# Patient Record
Sex: Female | Born: 1975 | Race: White | Hispanic: No | Marital: Single | Smoking: Current some day smoker
Health system: Southern US, Community
[De-identification: ages and names within clinical notes are randomized; demographics above are authoritative.]

## PROBLEM LIST (undated history)

## (undated) DIAGNOSIS — F419 Anxiety disorder, unspecified: Secondary | ICD-10-CM

## (undated) DIAGNOSIS — K589 Irritable bowel syndrome without diarrhea: Secondary | ICD-10-CM

## (undated) DIAGNOSIS — M797 Fibromyalgia: Secondary | ICD-10-CM

## (undated) DIAGNOSIS — N83209 Unspecified ovarian cyst, unspecified side: Secondary | ICD-10-CM

## (undated) DIAGNOSIS — J40 Bronchitis, not specified as acute or chronic: Secondary | ICD-10-CM

## (undated) DIAGNOSIS — F319 Bipolar disorder, unspecified: Secondary | ICD-10-CM

## (undated) DIAGNOSIS — E079 Disorder of thyroid, unspecified: Secondary | ICD-10-CM

## (undated) HISTORY — PX: COLONOSCOPY: SHX174

---

## 2003-05-06 ENCOUNTER — Inpatient Hospital Stay (HOSPITAL_COMMUNITY): Admission: EM | Admit: 2003-05-06 | Discharge: 2003-05-12 | Payer: Self-pay | Admitting: Psychiatry

## 2013-09-02 ENCOUNTER — Encounter (HOSPITAL_COMMUNITY): Payer: Self-pay | Admitting: Emergency Medicine

## 2013-09-02 ENCOUNTER — Emergency Department (HOSPITAL_COMMUNITY)
Admission: EM | Admit: 2013-09-02 | Discharge: 2013-09-02 | Disposition: A | Discharge status: Home / Self Care | Payer: Medicaid - Out of State | Attending: Emergency Medicine | Admitting: Emergency Medicine

## 2013-09-02 DIAGNOSIS — K5900 Constipation, unspecified: Secondary | ICD-10-CM | POA: Insufficient documentation

## 2013-09-02 DIAGNOSIS — N898 Other specified noninflammatory disorders of vagina: Secondary | ICD-10-CM | POA: Insufficient documentation

## 2013-09-02 DIAGNOSIS — R61 Generalized hyperhidrosis: Secondary | ICD-10-CM | POA: Diagnosis not present

## 2013-09-02 DIAGNOSIS — R1032 Left lower quadrant pain: Secondary | ICD-10-CM | POA: Diagnosis present

## 2013-09-02 DIAGNOSIS — R319 Hematuria, unspecified: Secondary | ICD-10-CM | POA: Diagnosis not present

## 2013-09-02 DIAGNOSIS — Z862 Personal history of diseases of the blood and blood-forming organs and certain disorders involving the immune mechanism: Secondary | ICD-10-CM | POA: Diagnosis not present

## 2013-09-02 DIAGNOSIS — Z3202 Encounter for pregnancy test, result negative: Secondary | ICD-10-CM | POA: Diagnosis not present

## 2013-09-02 DIAGNOSIS — Z8639 Personal history of other endocrine, nutritional and metabolic disease: Secondary | ICD-10-CM | POA: Insufficient documentation

## 2013-09-02 DIAGNOSIS — R509 Fever, unspecified: Secondary | ICD-10-CM | POA: Diagnosis not present

## 2013-09-02 DIAGNOSIS — F17200 Nicotine dependence, unspecified, uncomplicated: Secondary | ICD-10-CM | POA: Insufficient documentation

## 2013-09-02 HISTORY — DX: Unspecified ovarian cyst, unspecified side: N83.209

## 2013-09-02 HISTORY — DX: Irritable bowel syndrome, unspecified: K58.9

## 2013-09-02 HISTORY — DX: Disorder of thyroid, unspecified: E07.9

## 2013-09-02 LAB — URINALYSIS, ROUTINE W REFLEX MICROSCOPIC
Bilirubin Urine: NEGATIVE
Glucose, UA: NEGATIVE mg/dL
Ketones, ur: NEGATIVE mg/dL
Leukocytes, UA: NEGATIVE
Nitrite: NEGATIVE
Specific Gravity, Urine: >1.03 — ABNORMAL HIGH (ref 1.005–1.030)
Urobilinogen, UA: 0.2 mg/dL (ref 0.0–1.0)
pH: 5.5 (ref 5.0–8.0)

## 2013-09-02 LAB — URINE MICROSCOPIC-ADD ON

## 2013-09-02 LAB — POCT PREGNANCY, URINE: Preg Test, Ur: NEGATIVE

## 2013-09-02 LAB — WET PREP, GENITAL
Clue Cells Wet Prep HPF POC: NONE SEEN
Trich, Wet Prep: NONE SEEN
Yeast Wet Prep HPF POC: NONE SEEN

## 2013-09-02 MED ORDER — HYDROMORPHONE HCL PF 1 MG/ML IJ SOLN
1.0000 mg | Freq: Once | INTRAMUSCULAR | Status: AC
  Administered 2013-09-02: 1 mg via INTRAMUSCULAR
  Filled 2013-09-02: qty 1

## 2013-09-02 MED ORDER — IBUPROFEN 400 MG PO TABS
600.0000 mg | ORAL_TABLET | Freq: Once | ORAL | Status: AC
  Administered 2013-09-02: 600 mg via ORAL
  Filled 2013-09-02: qty 2

## 2013-09-02 MED ORDER — OXYCODONE-ACETAMINOPHEN 5-325 MG PO TABS
2.0000 | ORAL_TABLET | Freq: Once | ORAL | Status: AC
  Administered 2013-09-02: 2 via ORAL
  Filled 2013-09-02: qty 2

## 2013-09-02 MED ORDER — OXYCODONE-ACETAMINOPHEN 5-325 MG PO TABS: 1.0000 | ORAL_TABLET | ORAL | Status: DC | PRN

## 2013-09-02 MED ORDER — CEPHALEXIN 500 MG PO CAPS: 500.0000 mg | ORAL_CAPSULE | Freq: Four times a day (QID) | ORAL | Status: DC

## 2013-09-02 NOTE — ED Notes (Signed)
Dr Kohut at bedside speaking with pt,  

## 2013-09-02 NOTE — ED Notes (Signed)
Low abd pain esp LLQ, onset 9pm yesterday,   Feels constipated.  Fever. Chills,  Nausea, no vomiting

## 2013-09-02 NOTE — ED Provider Notes (Signed)
CSN: [id number]     Arrival date & time 09/02/13  2028 History  This chart was scribed for Raeford Razor, MD by Blanchard Kelch, ED Scribe. The patient was seen in room APA03/APA03. Patient's care was started at 9:02 PM.    Chief Complaint  Patient presents with  . Abdominal Pain    Patient is a 37 y.o. female presenting with abdominal pain. The history is provided by the patient. No language interpreter was used.  Abdominal Pain Associated symptoms: fever, hematuria and vaginal discharge   Associated symptoms: no dysuria     HPI Comments: Sarah Morrow is a 37 y.o. female with a history of IBS and ovarian cysts who presents to the Emergency Department complaining of constant left sided abdominal pain that began last night. The pain has been radiating down to her left leg and back. She states she she has had associated fever and diaphoresis with the pain. She states that she ate "two bowls of pickles" and laid down and then her abdomen felt tight which then progressed into the pain. The pain is worsened by movement. She tried using heat on her abdomen and lying in a hot bath without relief. She states that she noticed hematuria when she was urinating for the urinalysis in the ED. She also reports constipation. She denies dysuria, frequency or decreased urine output. She has had white, milky discharge for about a week but denies any foul odor associated with it. She states this pain is similar to the IBS as well as pain she has had with prior ovarian cysts. She states that her last period was 11/24 and normal. She reports she has a history of irregular periods but does not notice any pattern to the pain with her menstrual cycle. She denies any surgeries on her abdomen. She denies any possibility that she could have an STI.  Past Medical History  Diagnosis Date  . Thyroid disease   . Ovarian cyst   . IBS (irritable bowel syndrome)    History reviewed. No pertinent past surgical history. History  reviewed. No pertinent family history. History  Substance Use Topics  . Smoking status: Current Some Day Smoker  . Smokeless tobacco: Not on file  . Alcohol Use: No   OB History   Grav Para Term Preterm Abortions TAB SAB Ect Mult Living                 Review of Systems  Constitutional: Positive for fever and diaphoresis.  Gastrointestinal: Positive for abdominal pain.  Genitourinary: Positive for hematuria and vaginal discharge. Negative for dysuria, frequency and decreased urine volume.  All other systems reviewed and are negative.    Allergies  Review of patient's allergies indicates no known allergies.  Home Medications  No current outpatient prescriptions on file. Triage Vitals: BP 112/79  Pulse 87  Temp(Src) 98.5 F (36.9 C) (Oral)  Resp 18  Ht 5\' 6"  (1.676 m)  Wt 120 lb (54.432 kg)  BMI 19.38 kg/m2  SpO2 100%  LMP 07/26/2013  Physical Exam  Nursing note and vitals reviewed. Constitutional: She is oriented to person, place, and time. She appears well-developed and well-nourished.  HENT:  Head: Normocephalic and atraumatic.  Eyes: Conjunctivae and EOM are normal. Pupils are equal, round, and reactive to light.  Neck: Normal range of motion. Neck supple.  Cardiovascular: Normal rate, regular rhythm and normal heart sounds.   Pulmonary/Chest: Effort normal and breath sounds normal.  Abdominal: Soft. Bowel sounds are normal. She exhibits no  distension. There is tenderness in the suprapubic area and left lower quadrant. There is guarding (voluntary). There is no rebound and no CVA tenderness.  Genitourinary:  Chaperone present. normal external female genitalia. Mild white/grey vaginal discharge. No cmt, adnexal mass or tenderness appreciated  Musculoskeletal: Normal range of motion.  Neurological: She is alert and oriented to person, place, and time.  Skin: Skin is warm and dry.  Psychiatric: She has a normal mood and affect. Her behavior is normal.    ED Course   Procedures (including critical care time)  DIAGNOSTIC STUDIES: Oxygen Saturation is 100% on room air, normal by my interpretation.    COORDINATION OF CARE: 9:12 PM - Patient verbalizes understanding and agrees with treatment plan.    Labs Review Labs Reviewed  URINALYSIS, ROUTINE W REFLEX MICROSCOPIC  POCT PREGNANCY, URINE   Imaging Review No results found.  EKG Interpretation   None       MDM   1. LLQ pain   2. UTI  I personally preformed the services scribed in my presence. The recorded information has been reviewed is accurate. Raeford Razor, MD.    Raeford Razor, MD 09/06/13 [phone]

## 2013-09-02 NOTE — ED Notes (Addendum)
Sudden onset L pelvic pain last night w/occasional R sided pain as well.  L>R.  Hx of ovarian cysts.  Denies any dysuria, but noted slight blood on toilet paper while obtaining specimen.  Reports some nausea w/out vomiting or diarrhea.  Had chills and sweating last night.  Took hydrocodone cough syrup and ibuprofen for pain w/out relief.  Tried lying in warm water w/out relief.   Has had thick, creamy, white, nonpuritic vaginal discharge. Denies use of birth control and states she could possibly be pregnant but hasn't done OTC test.

## 2013-09-06 LAB — GC/CHLAMYDIA PROBE AMP
CT Probe RNA: NEGATIVE
GC Probe RNA: NEGATIVE

## 2014-10-07 ENCOUNTER — Emergency Department (HOSPITAL_COMMUNITY): Payer: Medicaid Other

## 2014-10-07 ENCOUNTER — Emergency Department (HOSPITAL_COMMUNITY)
Admission: EM | Admit: 2014-10-07 | Discharge: 2014-10-07 | Disposition: A | Discharge status: Home / Self Care | Payer: Medicaid Other | Attending: Emergency Medicine | Admitting: Emergency Medicine

## 2014-10-07 ENCOUNTER — Encounter (HOSPITAL_COMMUNITY): Payer: Self-pay | Admitting: Emergency Medicine

## 2014-10-07 DIAGNOSIS — Z8739 Personal history of other diseases of the musculoskeletal system and connective tissue: Secondary | ICD-10-CM | POA: Insufficient documentation

## 2014-10-07 DIAGNOSIS — Z720 Tobacco use: Secondary | ICD-10-CM | POA: Diagnosis not present

## 2014-10-07 DIAGNOSIS — F419 Anxiety disorder, unspecified: Secondary | ICD-10-CM | POA: Diagnosis present

## 2014-10-07 DIAGNOSIS — R6883 Chills (without fever): Secondary | ICD-10-CM | POA: Diagnosis not present

## 2014-10-07 DIAGNOSIS — Z8719 Personal history of other diseases of the digestive system: Secondary | ICD-10-CM | POA: Diagnosis not present

## 2014-10-07 DIAGNOSIS — R05 Cough: Secondary | ICD-10-CM | POA: Diagnosis not present

## 2014-10-07 DIAGNOSIS — Z8742 Personal history of other diseases of the female genital tract: Secondary | ICD-10-CM | POA: Diagnosis not present

## 2014-10-07 DIAGNOSIS — R059 Cough, unspecified: Secondary | ICD-10-CM

## 2014-10-07 DIAGNOSIS — Z792 Long term (current) use of antibiotics: Secondary | ICD-10-CM | POA: Insufficient documentation

## 2014-10-07 DIAGNOSIS — R079 Chest pain, unspecified: Secondary | ICD-10-CM | POA: Diagnosis not present

## 2014-10-07 DIAGNOSIS — R0981 Nasal congestion: Secondary | ICD-10-CM | POA: Diagnosis not present

## 2014-10-07 DIAGNOSIS — Z8639 Personal history of other endocrine, nutritional and metabolic disease: Secondary | ICD-10-CM | POA: Diagnosis not present

## 2014-10-07 HISTORY — DX: Anxiety disorder, unspecified: F41.9

## 2014-10-07 HISTORY — DX: Bipolar disorder, unspecified: F31.9

## 2014-10-07 HISTORY — DX: Fibromyalgia: M79.7

## 2014-10-07 MED ORDER — GUAIFENESIN ER 600 MG PO TB12: 600.0000 mg | ORAL_TABLET | Freq: Two times a day (BID) | ORAL | Status: DC

## 2014-10-07 MED ORDER — SALINE SPRAY 0.65 % NA SOLN: 1.0000 | NASAL | Status: DC | PRN

## 2014-10-07 MED ORDER — FLUTICASONE PROPIONATE 50 MCG/ACT NA SUSP: 2.0000 | Freq: Every day | NASAL | Status: DC

## 2014-10-07 MED ORDER — IBUPROFEN 800 MG PO TABS: 800.0000 mg | ORAL_TABLET | Freq: Three times a day (TID) | ORAL | Status: DC

## 2014-10-07 NOTE — Discharge Instructions (Signed)
Used both nasal sprays as directed along with taking Mucinex. Take ibuprofen as directed for pain. Follow-up with the wellness clinic or one of the resources below to establish care with a primary care physician.   Cough, Adult  A cough is a reflex that helps clear your throat and airways. It can help heal the body or may be a reaction to an irritated airway. A cough may only last 2 or 3 weeks (acute) or may last more than 8 weeks (chronic).  CAUSES Acute cough:  Viral or bacterial infections. Chronic cough:  Infections.  Allergies.  Asthma.  Post-nasal drip.  Smoking.  Heartburn or acid reflux.  Some medicines.  Chronic lung problems (COPD).  Cancer. SYMPTOMS   Cough.  Fever.  Chest pain.  Increased breathing rate.  High-pitched whistling sound when breathing (wheezing).  Colored mucus that you cough up (sputum). TREATMENT   A bacterial cough may be treated with antibiotic medicine.  A viral cough must run its course and will not respond to antibiotics.  Your caregiver may recommend other treatments if you have a chronic cough. HOME CARE INSTRUCTIONS   Only take over-the-counter or prescription medicines for pain, discomfort, or fever as directed by your caregiver. Use cough suppressants only as directed by your caregiver.  Use a cold steam vaporizer or humidifier in your bedroom or home to help loosen secretions.  Sleep in a semi-upright position if your cough is worse at night.  Rest as needed.  Stop smoking if you smoke. SEEK IMMEDIATE MEDICAL CARE IF:   You have pus in your sputum.  Your cough starts to worsen.  You cannot control your cough with suppressants and are losing sleep.  You begin coughing up blood.  You have difficulty breathing.  You develop pain which is getting worse or is uncontrolled with medicine.  You have a fever. MAKE SURE YOU:   Understand these instructions.  Will watch your condition.  Will get help right  away if you are not doing well or get worse. Document Released: 02/16/2011 Document Revised: 11/12/2011 Document Reviewed: 02/16/2011 [facility] Patient Information 2015 [place], [place]. This information is not intended to replace advice given to you by your health care provider. Make sure you discuss any questions you have with your health care provider.  Panic Attacks Panic attacks are sudden, short-livedsurges of severe anxiety, fear, or discomfort. They may occur for no reason when you are relaxed, when you are anxious, or when you are sleeping. Panic attacks may occur for a number of reasons:   Healthy people occasionally have panic attacks in extreme, life-threatening situations, such as war or natural disasters. Normal anxiety is a protective mechanism of the body that helps [place] react to danger (fight or flight response).  Panic attacks are often seen with anxiety disorders, such as panic disorder, social anxiety disorder, generalized anxiety disorder, and phobias. Anxiety disorders cause excessive or uncontrollable anxiety. They may interfere with your relationships or other life activities.  Panic attacks are sometimes seen with other mental illnesses, such as depression and posttraumatic stress disorder.  Certain medical conditions, prescription medicines, and drugs of abuse can cause panic attacks. SYMPTOMS  Panic attacks start suddenly, peak within 20 minutes, and are accompanied by four or more of the following symptoms:  Pounding heart or fast heart rate (palpitations).  Sweating.  Trembling or shaking.  Shortness of breath or feeling smothered.  Feeling choked.  Chest pain or discomfort.  Nausea or strange feeling in your stomach.  Dizziness,  light-headedness, or feeling like you will faint.  Chills or hot flushes.  Numbness or tingling in your lips or hands and feet.  Feeling that things are not real or feeling that you are not yourself.  Fear of losing control or  going crazy.  Fear of dying. Some of these symptoms can mimic serious medical conditions. For example, you may think you are having a heart attack. Although panic attacks can be very scary, they are not life threatening. DIAGNOSIS  Panic attacks are diagnosed through an assessment by your health care provider. Your health care provider will ask questions about your symptoms, such as where and when they occurred. Your health care provider will also ask about your medical history and use of alcohol and drugs, including prescription medicines. Your health care provider may order blood tests or other studies to rule out a serious medical condition. Your health care provider may refer you to a mental health professional for further evaluation. TREATMENT   Most healthy people who have one or two panic attacks in an extreme, life-threatening situation will not require treatment.  The treatment for panic attacks associated with anxiety disorders or other mental illness typically involves counseling with a mental health professional, medicine, or a combination of both. Your health care provider will help determine what treatment is best for you.  Panic attacks due to physical illness usually go away with treatment of the illness. If prescription medicine is causing panic attacks, talk with your health care provider about stopping the medicine, decreasing the dose, or substituting another medicine.  Panic attacks due to alcohol or drug abuse go away with abstinence. Some adults need professional help in order to stop drinking or using drugs. HOME CARE INSTRUCTIONS   Take all medicines as directed by your health care provider.   Schedule and attend follow-up visits as directed by your health care provider. It is important to keep all your appointments. SEEK MEDICAL CARE IF:  You are not able to take your medicines as prescribed.  Your symptoms do not improve or get worse. SEEK IMMEDIATE MEDICAL CARE  IF:   You experience panic attack symptoms that are different than your usual symptoms.  You have serious thoughts about hurting yourself or others.  You are taking medicine for panic attacks and have a serious side effect. MAKE SURE YOU:  Understand these instructions.  Will watch your condition.  Will get help right away if you are not doing well or get worse. Document Released: 08/20/2005 Document Revised: 08/25/2013 Document Reviewed: 04/03/2013 [facility] Patient Information 2015 [place]. This information is not intended to replace advice given to you by your health care provider. Make sure you discuss any questions you have with your health care provider.  Cool Mist Vaporizers Vaporizers may help relieve the symptoms of a cough and cold. They add moisture to the air, which helps mucus to become thinner and less sticky. This makes it easier to breathe and cough up secretions. Cool mist vaporizers do not cause serious burns like hot mist vaporizers, which may also be called steamers or humidifiers. Vaporizers have not been proven to help with colds. You should not use a vaporizer if you are allergic to mold. HOME CARE INSTRUCTIONS  Follow the package instructions for the vaporizer.  Do not use anything other than distilled water in the vaporizer.  Do not run the vaporizer all of the time. This can cause mold or bacteria to grow in the vaporizer.  Clean the vaporizer after  each time it is used.  Clean and dry the vaporizer well before storing it.  Stop using the vaporizer if worsening respiratory symptoms develop. Document Released: 05/17/2004 Document Revised: 08/25/2013 Document Reviewed: 01/07/2013 [facility] Patient Information 2015 [place], [place]. This information is not intended to replace advice given to you by your health care provider. Make sure you discuss any questions you have with your health care provider.  RESOURCE GUIDE  Chronic Pain Problems: Contact  [facility]  [phone] Patients need to be referred by their primary care doctor.  Insufficient Money for Medicine: Contact [employer]:  call "211."   No Primary Care Doctor: - Call [facility]  [phone] - can help you locate a primary care doctor that  accepts your insurance, provides certain services, etc. - Physician Referral Service- [phone]  Agencies that provide inexpensive medical care: - [facility]  [phone] - [facility]  [phone] - [facility]  [phone] - [facility]  [phone] - [facility]  [phone] - [facility]  [phone]  Medicaid-accepting [facility] Providers: - [facility]- [address]  [phone], Mon-Fri 9am-7pm, Sat 9am-1pm - [facility]- [address] [place], [address] [place]  [phone] - [facility]- [address] [place]  [phone] [facility]- [address]  [phone] - Renaye Rakers- [address] [place], [address], [phone]  Only accepts [place] patients after they have their name  applied to their card  Self Pay (no insurance) in [place]: - Sickle Cell Patients: Dr Willey Blade, [facility]  [address] [place], [phone] - [facility]- [address] [place]  [phone]       [facility]- [address]       -     [facility]- see information above (Speak to [employer] if you do not have insurance)       -  [facility]- [address],  [phone]       -  [facility]- [address] [phone]       -  Dr Julio Sicks-  [address], [place], [phone]       -  [facility] - [address] [phone]       -  [facility]- [address], [phone], also [address], [phone]        -    [facility]- [address] [place], [phone], 1st & 3rd Saturday        every month, 10am-1pm  1) Find a Doctor and Pay Out of Pocket Although you won't have to find out who is covered by your insurance plan, it is a good idea to ask around and get recommendations. You will then need to call the office and see if the doctor you have chosen will accept you as a new patient and what types of options they offer for patients who are self-pay. Some doctors offer discounts or will set up payment plans for their patients who do not have insurance, but you will need to ask so you aren't surprised when you get to your appointment.  2) Contact Your Local Health Department Not all health departments have doctors that  can see patients for sick visits, but many do, so it is worth a call to see if yours does. If you don't know where your local health department is, you can check in your phone book. The CDC also has a tool to help you locate your state's health department, and many state websites also have listings of all of their local health departments.  3) Find a Walk-in Clinic If your illness is not likely to be very severe or complicated, you may want to try a walk in clinic. These are popping up all over the country in pharmacies, drugstores, and shopping centers. They're usually staffed by nurse practitioners or physician assistants that have been trained to treat common illnesses and complaints. They're usually fairly quick and inexpensive. However, if you have serious medical issues or chronic medical problems, these are probably not your best option

## 2014-10-07 NOTE — ED Provider Notes (Signed)
CSN: [id number]     Arrival date & time 10/07/14  1941 History   First MD Initiated Contact with Patient 10/07/14 2103     Chief Complaint  Patient presents with  . Anxiety  . Cough     (Consider location/radiation/quality/duration/timing/severity/associated sxs/prior Treatment) HPI Comments: 39 year old female with a past medical history of thyroid disease, anxiety, ovarian cyst, IBS, bipolar disorder and fibromyalgia presenting with cough 3 weeks. Patient reports she feels congested, both nasally and in her chest, has been clearing her throat and occasionally spitting up phlegm. She has tried taking her children's cough medicine with no relief. Denies fever, however endorses occasional chills. Patient also reports she had an anxiety attack today, followed by midsternal chest pain, worse with coughing. States she is under a lot of stress. Denies suicidal or homicidal ideation.  Patient is a 39 y.o. female presenting with anxiety and cough. The history is provided by the patient.  Anxiety Associated symptoms include chest pain, chills, congestion and coughing.  Cough Associated symptoms: chest pain and chills     Past Medical History  Diagnosis Date  . Thyroid disease   . Ovarian cyst   . IBS (irritable bowel syndrome)   . Bipolar 1 disorder   . Fibromyalgia   . Anxiety    Past Surgical History  Procedure Laterality Date  . Colonoscopy     No family history on file. History  Substance Use Topics  . Smoking status: Current Some Day Smoker  . Smokeless tobacco: Not on file  . Alcohol Use: No   OB History    No data available     Review of Systems  Constitutional: Positive for chills.  HENT: Positive for congestion and sinus pressure.   Respiratory: Positive for cough.   Cardiovascular: Positive for chest pain.  Psychiatric/Behavioral: The patient is nervous/anxious.   All other systems reviewed and are negative.     Allergies  Review of patient's allergies  indicates no known allergies.  Home Medications   Prior to Admission medications   Medication Sig Start Date End Date Taking? Authorizing Provider  cephALEXin (KEFLEX) 500 MG capsule Take 1 capsule (500 mg total) by mouth 4 (four) times daily. 09/02/13   Raeford RazorStephen Kohut, MD  fluticasone (FLONASE) 50 MCG/ACT nasal spray Place 2 sprays into both nostrils daily. 10/07/14   Kathrynn Speedobyn M Kadisha Goodine, PA-C  guaiFENesin (MUCINEX) 600 MG 12 hr tablet Take 1 tablet (600 mg total) by mouth 2 (two) times daily. 10/07/14   Kathrynn Speedobyn M George Alcantar, PA-C  ibuprofen (ADVIL,MOTRIN) 800 MG tablet Take 1 tablet (800 mg total) by mouth 3 (three) times daily. 10/07/14   Kathrynn Speedobyn M Nanea Jared, PA-C  oxyCODONE-acetaminophen (PERCOCET/ROXICET) 5-325 MG per tablet Take 1-2 tablets by mouth every 4 (four) hours as needed for severe pain. 09/02/13   Raeford RazorStephen Kohut, MD  sodium chloride (OCEAN) 0.65 % SOLN nasal spray Place 1 spray into both nostrils as needed for congestion. 10/07/14   Treylon Henard M Haleema Vanderheyden, PA-C   BP 102/85 mmHg  Pulse 74  Temp(Src) 97.7 F (36.5 C) (Oral)  Resp 14  SpO2 99%  LMP 10/05/2014 Physical Exam  Constitutional: She is oriented to person, place, and time. She appears well-developed and well-nourished. No distress.  HENT:  Head: Normocephalic and atraumatic.  Mouth/Throat: Oropharynx is clear and moist.  Nasal congestion, mucosal edema, postnasal drip. Bilateral maxillary sinus tenderness.  Eyes: Conjunctivae and EOM are normal. Pupils are equal, round, and reactive to light.  Neck: Normal range of motion. Neck supple.  No JVD present.  Cardiovascular: Normal rate, regular rhythm, normal heart sounds and intact distal pulses.   No extremity edema.  Pulmonary/Chest: Effort normal and breath sounds normal. No respiratory distress. She exhibits tenderness.  TTP across entire chest.  Abdominal: Soft. Bowel sounds are normal. There is no tenderness.  Musculoskeletal: Normal range of motion. She exhibits no edema.  Neurological: She is  alert and oriented to person, place, and time. She has normal strength. No sensory deficit.  Speech fluent, goal oriented. Moves limbs without ataxia. Equal grip strength bilateral.  Skin: Skin is warm and dry. She is not diaphoretic.  Psychiatric: Her behavior is normal. Her mood appears anxious.  Nursing note and vitals reviewed.   ED Course  Procedures (including critical care time) Labs Review Labs Reviewed - No data to display  Imaging Review Dg Chest 2 View  10/07/2014   CLINICAL DATA:  39 year old female with a history of dry cough.  EXAM: CHEST - 2 VIEW  COMPARISON:  None.  FINDINGS: Cardiomediastinal silhouette projects within normal limits in size and contour. No confluent airspace disease, pneumothorax, or pleural effusion.  No displaced fracture.  Unremarkable appearance of the upper abdomen.  IMPRESSION: No radiographic evidence of acute cardiopulmonary disease.  Signed,  Yvone Neu. Loreta Ave, DO  [facility]  [facility]   Electronically Signed   By: Gilmer Mor D.O.   On: 10/07/2014 20:47     EKG Interpretation None      MDM   Final diagnoses:  Cough  Sinus congestion  Anxiety   Patient nontoxic appearing and in no apparent distress. AFVSS. Chest pain reproducible. Chest pain most likely from coughing and anxiety. Doubt cardiac. Chest x-ray obtained in triage prior to patient being seen, no acute findings. Lungs clear. Treat sinus congestion and cough with Mucinex, Flonase, nasal saline. Ibuprofen for pain. Resources given for PCP follow-up. Stable for discharge. Return precautions given. Patient states understanding of treatment care plan and is agreeable.  Kathrynn Speed, PA-C 10/07/14 2125  Gerhard Munch, MD 10/07/14 2150

## 2014-10-07 NOTE — ED Notes (Signed)
Pt. reports anxiety attack today and dry cough with chest congestion , denies fever or chills / respirations unlabored .

## 2014-12-15 ENCOUNTER — Encounter (HOSPITAL_COMMUNITY): Payer: Self-pay | Admitting: Emergency Medicine

## 2014-12-15 ENCOUNTER — Emergency Department (HOSPITAL_COMMUNITY)
Admission: EM | Admit: 2014-12-15 | Discharge: 2014-12-15 | Disposition: A | Discharge status: Home / Self Care | Payer: Medicaid Other | Source: Home / Self Care | Attending: Emergency Medicine | Admitting: Emergency Medicine

## 2014-12-15 DIAGNOSIS — J111 Influenza due to unidentified influenza virus with other respiratory manifestations: Secondary | ICD-10-CM | POA: Diagnosis not present

## 2014-12-15 MED ORDER — OSELTAMIVIR PHOSPHATE 75 MG PO CAPS: 75.0000 mg | ORAL_CAPSULE | Freq: Two times a day (BID) | ORAL | Status: DC

## 2014-12-15 NOTE — Discharge Instructions (Signed)
You have the flu. Get plenty of rest and drink plenty of fluids. Take Tamiflu as prescribed. Alternate Tylenol and ibuprofen every 4 hours help with fever and body aches. Follow-up as needed.

## 2014-12-15 NOTE — ED Notes (Signed)
Pt woke up this morning with a sore throat, bilateral earache, diarrhea and generalized body aches.

## 2014-12-15 NOTE — ED Provider Notes (Signed)
CSN: [id number]     Arrival date & time 12/15/14  1207 History   First MD Initiated Contact with Patient 12/15/14 1235     Chief Complaint  Patient presents with  . Sore Throat  . Cough  . Diarrhea  . Generalized Body Aches   (Consider location/radiation/quality/duration/timing/severity/associated sxs/prior Treatment) HPI  She is a 39 year old woman here for evaluation of body aches. Her symptoms started this morning with generalized severe body aches, nasal congestion, cough, sore throat, ear pain. She denies any nausea or vomiting. She denies any shortness of breath. No fever. She does report chills. No known sick contacts.  Past Medical History  Diagnosis Date  . Thyroid disease   . Ovarian cyst   . IBS (irritable bowel syndrome)   . Bipolar 1 disorder   . Fibromyalgia   . Anxiety    Past Surgical History  Procedure Laterality Date  . Colonoscopy     History reviewed. No pertinent family history. History  Substance Use Topics  . Smoking status: Current Some Day Smoker  . Smokeless tobacco: Not on file  . Alcohol Use: No   OB History    No data available     Review of Systems  Constitutional: Positive for fever, appetite change and fatigue.  HENT: Positive for congestion, ear pain and sore throat. Negative for rhinorrhea.   Respiratory: Positive for cough. Negative for shortness of breath.   Gastrointestinal: Positive for diarrhea. Negative for nausea and vomiting.  Musculoskeletal: Positive for myalgias.    Allergies  Review of patient's allergies indicates no known allergies.  Home Medications   Prior to Admission medications   Medication Sig Start Date End Date Taking? Authorizing Provider  clonazePAM (KLONOPIN) 1 MG tablet Take 1 mg by mouth 4 (four) times daily.   Yes Historical Provider, MD  lamoTRIgine (LAMICTAL) 200 MG tablet Take 200 mg by mouth daily.   Yes Historical Provider, MD  lisdexamfetamine (VYVANSE) 70 MG capsule Take 70 mg by mouth daily.    Yes Historical Provider, MD  ibuprofen (ADVIL,MOTRIN) 800 MG tablet Take 1 tablet (800 mg total) by mouth 3 (three) times daily. 10/07/14   Kathrynn Speedobyn M Hess, PA-C  oseltamivir (TAMIFLU) 75 MG capsule Take 1 capsule (75 mg total) by mouth every 12 (twelve) hours. 12/15/14   Charm RingsErin J Honig, MD  oxyCODONE-acetaminophen (PERCOCET/ROXICET) 5-325 MG per tablet Take 1-2 tablets by mouth every 4 (four) hours as needed for severe pain. 09/02/13   Raeford RazorStephen Kohut, MD   BP 101/70 mmHg  Pulse 95  Temp(Src) 98.7 F (37.1 C) (Oral)  Resp 18  SpO2 97%  LMP 11/16/2014 (Exact Date) Physical Exam  Constitutional: She is oriented to person, place, and time. She appears well-developed and well-nourished. She appears distressed (looks ill).  HENT:  Head: Normocephalic and atraumatic.  Right Ear: External ear normal. Tympanic membrane is retracted.  Left Ear: External ear normal. Tympanic membrane is retracted.  Nose: Rhinorrhea present. No mucosal edema.  Mouth/Throat: Oropharynx is clear and moist. No oropharyngeal exudate.  Neck: Neck supple.  Cardiovascular: Normal rate, regular rhythm and normal heart sounds.   No murmur heard. Pulmonary/Chest: Effort normal and breath sounds normal. No respiratory distress. She has no wheezes. She has no rales.  Lymphadenopathy:    She has no cervical adenopathy.  Neurological: She is alert and oriented to person, place, and time.    ED Course  Procedures (including critical care time) Labs Review Labs Reviewed - No data to display  Imaging Review  No results found.   MDM   1. Influenza    We'll treat with Tamiflu. Plenty of rest and fluids. Tylenol or ibuprofen for body aches and fever. Follow-up as needed.    Charm Rings, MD 12/15/14 1308

## 2015-01-06 ENCOUNTER — Encounter (HOSPITAL_COMMUNITY): Payer: Self-pay | Admitting: Emergency Medicine

## 2015-01-06 ENCOUNTER — Emergency Department (HOSPITAL_COMMUNITY)
Admission: EM | Admit: 2015-01-06 | Discharge: 2015-01-06 | Disposition: A | Discharge status: Home / Self Care | Payer: Medicaid Other | Attending: Emergency Medicine | Admitting: Emergency Medicine

## 2015-01-06 DIAGNOSIS — F319 Bipolar disorder, unspecified: Secondary | ICD-10-CM | POA: Diagnosis not present

## 2015-01-06 DIAGNOSIS — Z8639 Personal history of other endocrine, nutritional and metabolic disease: Secondary | ICD-10-CM | POA: Diagnosis not present

## 2015-01-06 DIAGNOSIS — Z720 Tobacco use: Secondary | ICD-10-CM | POA: Insufficient documentation

## 2015-01-06 DIAGNOSIS — Z8742 Personal history of other diseases of the female genital tract: Secondary | ICD-10-CM | POA: Diagnosis not present

## 2015-01-06 DIAGNOSIS — Z79899 Other long term (current) drug therapy: Secondary | ICD-10-CM | POA: Insufficient documentation

## 2015-01-06 DIAGNOSIS — R001 Bradycardia, unspecified: Secondary | ICD-10-CM | POA: Insufficient documentation

## 2015-01-06 DIAGNOSIS — R58 Hemorrhage, not elsewhere classified: Secondary | ICD-10-CM | POA: Diagnosis not present

## 2015-01-06 DIAGNOSIS — Z791 Long term (current) use of non-steroidal anti-inflammatories (NSAID): Secondary | ICD-10-CM | POA: Insufficient documentation

## 2015-01-06 DIAGNOSIS — M2550 Pain in unspecified joint: Secondary | ICD-10-CM | POA: Diagnosis not present

## 2015-01-06 DIAGNOSIS — R52 Pain, unspecified: Secondary | ICD-10-CM | POA: Diagnosis present

## 2015-01-06 DIAGNOSIS — Z8719 Personal history of other diseases of the digestive system: Secondary | ICD-10-CM | POA: Diagnosis not present

## 2015-01-06 DIAGNOSIS — F419 Anxiety disorder, unspecified: Secondary | ICD-10-CM | POA: Insufficient documentation

## 2015-01-06 DIAGNOSIS — Z7952 Long term (current) use of systemic steroids: Secondary | ICD-10-CM | POA: Diagnosis not present

## 2015-01-06 DIAGNOSIS — M797 Fibromyalgia: Secondary | ICD-10-CM | POA: Diagnosis not present

## 2015-01-06 DIAGNOSIS — J069 Acute upper respiratory infection, unspecified: Secondary | ICD-10-CM | POA: Insufficient documentation

## 2015-01-06 MED ORDER — TRAMADOL HCL 50 MG PO TABS: ORAL_TABLET | ORAL | Status: DC

## 2015-01-06 MED ORDER — LEVOTHYROXINE SODIUM 75 MCG PO TABS: ORAL_TABLET | ORAL | Status: DC

## 2015-01-06 MED ORDER — LORATADINE-PSEUDOEPHEDRINE ER 5-120 MG PO TB12
1.0000 | ORAL_TABLET | Freq: Two times a day (BID) | ORAL | Status: DC
Start: 2015-01-06 — End: 2015-05-06

## 2015-01-06 NOTE — ED Provider Notes (Signed)
CSN: [id number]     Arrival date & time 01/06/15  1656 History   First MD Initiated Contact with Patient 01/06/15 1806     Chief Complaint  Patient presents with  . Generalized Body Aches  . Sore Throat     (Consider location/radiation/quality/duration/timing/severity/associated sxs/prior Treatment) HPI Comments: Patient is a 39 year old female who presents to the emergency department with generalized body aches and sore throat. The patient states that this has been going on for some time now. She most recently was seen approximately 3 or 4 days [facility] which time she was told that she had a bronchitis. She had been told 2 weeks prior to that she had the flu. She states that she just doesn't feel as though she is getting any better. She has been treated with Zithromax, prednisone, and Tessalon Perles, but feels like she is not getting any better.  It is of note that the patient suffers from thyroid disorder, and has not been taking her thyroid medication for several months. It is also note that she suffers from fibromyalgia, and has not been taking medicines for this either. The patient suffers from irritable bowel syndrome and has not had this evaluated for several months. The patient states that she is not eating well, she's not sleeping well, she is not keeping up with fluids. She came to the emergency department because she was told by her family that she could possibly "die" if she did not go get herself straight now, especially with regard to her thyroid. The patient states she has a dry cough. She denies any hemoptysis. She denies any temperature elevation. She denies any blood in the urine, and she denies any blood in her stool.  Patient is a 39 y.o. female presenting with pharyngitis. The history is provided by the patient.  Sore Throat This is a new problem. The current episode started in the past 7 days. The problem occurs intermittently. The problem has been gradually  worsening. Associated symptoms include arthralgias, chills, congestion, coughing, fatigue, myalgias and a sore throat. Pertinent negatives include no fever. The symptoms are aggravated by swallowing. Treatments tried: tessalon. The treatment provided moderate relief.    Past Medical History  Diagnosis Date  . Thyroid disease   . Ovarian cyst   . IBS (irritable bowel syndrome)   . Bipolar 1 disorder   . Fibromyalgia   . Anxiety    Past Surgical History  Procedure Laterality Date  . Colonoscopy     History reviewed. No pertinent family history. History  Substance Use Topics  . Smoking status: Current Some Day Smoker  . Smokeless tobacco: Not on file  . Alcohol Use: No   OB History    No data available     Review of Systems  Constitutional: Positive for chills and fatigue. Negative for fever.  HENT: Positive for congestion, postnasal drip and sore throat.   Respiratory: Positive for cough.   Cardiovascular: Negative for palpitations and leg swelling.  Musculoskeletal: Positive for myalgias and arthralgias.  Hematological: Bruises/bleeds easily.  All other systems reviewed and are negative.     Allergies  Review of patient's allergies indicates no known allergies.  Home Medications   Prior to Admission medications   Medication Sig Start Date End Date Taking? Authorizing Provider  azithromycin (ZITHROMAX) 250 MG tablet Take 250-500 mg by mouth See admin instructions. Take two tablets on day 1` then take on tablet on days 2 through 5   Yes Historical Provider,  MD  benzonatate (TESSALON) 100 MG capsule Take 100 mg by mouth 3 (three) times daily as needed for cough.   Yes Historical Provider, MD  clonazePAM (KLONOPIN) 1 MG tablet Take 2 mg by mouth at bedtime.    Yes Historical Provider, MD  lamoTRIgine (LAMICTAL) 200 MG tablet Take 200 mg by mouth at bedtime.   Yes Historical Provider, MD  lisdexamfetamine (VYVANSE) 70 MG capsule Take 70 mg by mouth every morning.    Yes  Historical Provider, MD  predniSONE (DELTASONE) 20 MG tablet Take 40 mg by mouth daily with lunch. 5 day supply filled 01/04/15   Yes Historical Provider, MD  ibuprofen (ADVIL,MOTRIN) 800 MG tablet Take 1 tablet (800 mg total) by mouth 3 (three) times daily. Patient not taking: Reported on 01/06/2015 10/07/14   Kathrynn Speed, PA-C  oseltamivir (TAMIFLU) 75 MG capsule Take 1 capsule (75 mg total) by mouth every 12 (twelve) hours. Patient not taking: Reported on 01/06/2015 12/15/14   Charm Rings, MD  oxyCODONE-acetaminophen (PERCOCET/ROXICET) 5-325 MG per tablet Take 1-2 tablets by mouth every 4 (four) hours as needed for severe pain. Patient not taking: Reported on 01/06/2015 09/02/13   Raeford Razor, MD   BP 113/67 mmHg  Pulse 63  Temp(Src) 98.3 F (36.8 C) (Oral)  Resp 20  Ht  (1.676 m)  Wt 121 lb (54.885 kg)  BMI 19.54 kg/m2  SpO2 100%  LMP 12/17/2014 Physical Exam  Constitutional: She is oriented to person, place, and time. She appears well-developed and well-nourished.  Non-toxic appearance.  HENT:  Head: Normocephalic.  Right Ear: Tympanic membrane and external ear normal.  Left Ear: Tympanic membrane and external ear normal.  Mild to moderate nasal congestion present.  Eyes: EOM and lids are normal. Pupils are equal, round, and reactive to light.  Neck: Normal range of motion. Neck supple. Carotid bruit is not present.  Cardiovascular: Regular rhythm, normal heart sounds, intact distal pulses and normal pulses.  Bradycardia present.   Bradycardia of 50.  Pulmonary/Chest: Breath sounds normal. No respiratory distress.  Abdominal: Soft. Bowel sounds are normal. There is no tenderness. There is no guarding.  Musculoskeletal: Normal range of motion.  Lymphadenopathy:       Head (right side): No submandibular adenopathy present.       Head (left side): No submandibular adenopathy present.    She has no cervical adenopathy.  Neurological: She is alert and oriented to person, place, and  time. She has normal strength. No cranial nerve deficit or sensory deficit.  Skin: Skin is warm and dry.  Few bruises on the anterior lower extremities right and left.  Psychiatric: She has a normal mood and affect. Her speech is normal.  Nursing note and vitals reviewed.   ED Course  Procedures (including critical care time) Labs Review Labs Reviewed - No data to display  Imaging Review No results found.   EKG Interpretation None      MDM  This patient has not taken her thyroid medication for several months. We'll start levothyroxin. The patient also has not taken any medication for taken any medication for fibromyalgia. Will use Ultram every 6 hours. I have strongly encouraged patient to speak with her Medicaid physician on tomorrow to resume these medications and to be rechecked. The vital signs are within normal limits, with exception of the patient having a bradycardia of 50 by my evaluation. I've also encouraged the patient to increase fluids, eat her meals on time. The patient is to  return to the emergency department if any changes, problems, or concerns before she is evaluated by her Medicaid physician.    Final diagnoses:  None    **I have reviewed nursing notes, vital signs, and all appropriate lab and imaging results for this patient.Ivery Quale*    Oniyah Rohe, PA-C 01/06/15 2128  Margarita Grizzleanielle Ray, MD 01/06/15 [phone]

## 2015-01-06 NOTE — Discharge Instructions (Signed)
Your heart rate is lower than it should be. I am concerned that this may be related to your thyroid. It is imperative that you speak with your doctor tomorrow for an appointment as sone as possible concerning your multiple medical problems. Please use the Synthroid 75 mg one at bedtime. Please use the Claritin-D every 12 hours for congestion and cough. Please use the Tylenol or ibuprofen for mild discomfort. Use Ultram for more severe pain. This medication may cause drowsiness, please use with caution. Please increase your water, juices, Gatorade, etc. Please eat her meals on time. Upper Respiratory Infection, Adult An upper respiratory infection (URI) is also sometimes known as the common cold. The upper respiratory tract includes the nose, sinuses, throat, trachea, and bronchi. Bronchi are the airways leading to the lungs. Most people improve within 1 week, but symptoms can last up to 2 weeks. A residual cough may last even longer.  CAUSES Many different viruses can infect the tissues lining the upper respiratory tract. The tissues become irritated and inflamed and often become very moist. Mucus production is also common. A cold is contagious. You can easily spread the virus to others by oral contact. This includes kissing, sharing a glass, coughing, or sneezing. Touching your mouth or nose and then touching a surface, which is then touched by another person, can also spread the virus. SYMPTOMS  Symptoms typically develop 1 to 3 days after you come in contact with a cold virus. Symptoms vary from person to person. They may include:  Runny nose.  Sneezing.  Nasal congestion.  Sinus irritation.  Sore throat.  Loss of voice (laryngitis).  Cough.  Fatigue.  Muscle aches.  Loss of appetite.  Headache.  Low-grade fever. DIAGNOSIS  You might diagnose your own cold based on familiar symptoms, since most people get a cold 2 to 3 times a year. Your caregiver can confirm this based on your  exam. Most importantly, your caregiver can check that your symptoms are not due to another disease such as strep throat, sinusitis, pneumonia, asthma, or epiglottitis. Blood tests, throat tests, and X-rays are not necessary to diagnose a common cold, but they may sometimes be helpful in excluding other more serious diseases. Your caregiver will decide if any further tests are required. RISKS AND COMPLICATIONS  You may be at risk for a more severe case of the common cold if you smoke cigarettes, have chronic heart disease (such as heart failure) or lung disease (such as asthma), or if you have a weakened immune system. The very young and very old are also at risk for more serious infections. Bacterial sinusitis, middle ear infections, and bacterial pneumonia can complicate the common cold. The common cold can worsen asthma and chronic obstructive pulmonary disease (COPD). Sometimes, these complications can require emergency medical care and may be life-threatening. PREVENTION  The best way to protect against getting a cold is to practice good hygiene. Avoid oral or hand contact with people with cold symptoms. Wash your hands often if contact occurs. There is no clear evidence that vitamin C, vitamin E, echinacea, or exercise reduces the chance of developing a cold. However, it is always recommended to get plenty of rest and practice good nutrition. TREATMENT  Treatment is directed at relieving symptoms. There is no cure. Antibiotics are not effective, because the infection is caused by a virus, not by bacteria. Treatment may include:  Increased fluid intake. Sports drinks offer valuable electrolytes, sugars, and fluids.  Breathing heated mist or steam (vaporizer  or shower).  Eating chicken soup or other clear broths, and maintaining good nutrition.  Getting plenty of rest.  Using gargles or lozenges for comfort.  Controlling fevers with ibuprofen or acetaminophen as directed by your  caregiver.  Increasing usage of your inhaler if you have asthma. Zinc gel and zinc lozenges, taken in the first 24 hours of the common cold, can shorten the duration and lessen the severity of symptoms. Pain medicines may help with fever, muscle aches, and throat pain. A variety of non-prescription medicines are available to treat congestion and runny nose. Your caregiver can make recommendations and may suggest nasal or lung inhalers for other symptoms.  HOME CARE INSTRUCTIONS   Only take over-the-counter or prescription medicines for pain, discomfort, or fever as directed by your caregiver.  Use a warm mist humidifier or inhale steam from a shower to increase air moisture. This may keep secretions moist and make it easier to breathe.  Drink enough water and fluids to keep your urine clear or pale yellow.  Rest as needed.  Return to work when your temperature has returned to normal or as your caregiver advises. You may need to stay home longer to avoid infecting others. You can also use a face mask and careful hand washing to prevent spread of the virus. SEEK MEDICAL CARE IF:   After the first few days, you feel you are getting worse rather than better.  You need your caregiver's advice about medicines to control symptoms.  You develop chills, worsening shortness of breath, or brown or red sputum. These may be signs of pneumonia.  You develop yellow or brown nasal discharge or pain in the face, especially when you bend forward. These may be signs of sinusitis.  You develop a fever, swollen neck glands, pain with swallowing, or white areas in the back of your throat. These may be signs of strep throat. SEEK IMMEDIATE MEDICAL CARE IF:   You have a fever.  You develop severe or persistent headache, ear pain, sinus pain, or chest pain.  You develop wheezing, a prolonged cough, cough up blood, or have a change in your usual mucus (if you have chronic lung disease).  You develop sore  muscles or a stiff neck. Document Released: 02/13/2001 Document Revised: 11/12/2011 Document Reviewed: 11/25/2013 [facility] Patient Information 2015 [place], [place]. This information is not intended to replace advice given to you by your health care provider. Make sure you discuss any questions you have with your health care provider.  Bradycardia Bradycardia is a term for a heart rate (pulse) that, in adults, is slower than 60 beats per minute. A normal rate is 60 to 100 beats per minute. A heart rate below 60 beats per minute may be normal for some adults with healthy hearts. If the rate is too slow, the heart may have trouble pumping the volume of blood the body needs. If the heart rate gets too low, blood flow to the brain may be decreased and may make you feel lightheaded, dizzy, or faint. The heart has a natural pacemaker in the top of the heart called the SA node (sinoatrial or sinus node). This pacemaker sends out regular electrical signals to the muscle of the heart, telling the heart muscle when to beat (contract). The electrical signal travels from the upper parts of the heart (atria) through the AV node (atrioventricular node), to the lower chambers of the heart (ventricles). The ventricles squeeze, pumping the blood from your heart to your lungs and to  the rest of your body. CAUSES   Problem with the heart's electrical system.  Problem with the heart's natural pacemaker.  Heart disease, damage, or infection.  Medications.  Problems with minerals and salts (electrolytes). SYMPTOMS   Fainting (syncope).  Fatigue and weakness.  Shortness of breath (dyspnea).  Chest pain (angina).  Drowsiness.  Confusion. DIAGNOSIS   An electrocardiogram (ECG) can help your caregiver determine the type of slow heart rate you have.  If the cause is not seen on an ECG, you may need to wear a heart monitor that records your heart rhythm for several hours or days.  Blood tests. TREATMENT    Electrolyte supplements.  Medications.  Withholding medication which is causing a slow heart rate.  Pacemaker placement. SEEK IMMEDIATE MEDICAL CARE IF:   You feel lightheaded or faint.  You develop an irregular heart rate.  You feel chest pain or have trouble breathing. MAKE SURE YOU:   Understand these instructions.  Will watch your condition.  Will get help right away if you are not doing well or get worse. Document Released: 05/12/2002 Document Revised: 11/12/2011 Document Reviewed: 11/25/2013 [facility] Patient Information 2015 [place]. This information is not intended to replace advice given to you by your health care provider. Make sure you discuss any questions you have with your health care provider.

## 2015-01-06 NOTE — ED Notes (Signed)
Patient complaining of generalized body aches and sore throat x 4 days. [facility] and was told she had the [facility]. States she was given z-pack, prednisone, and cough medications but is feeling no better.

## 2015-05-06 ENCOUNTER — Emergency Department (HOSPITAL_COMMUNITY)
Admission: EM | Admit: 2015-05-06 | Discharge: 2015-05-06 | Disposition: A | Discharge status: Home / Self Care | Payer: Medicaid Other | Attending: Physician Assistant | Admitting: Physician Assistant

## 2015-05-06 ENCOUNTER — Encounter (HOSPITAL_COMMUNITY): Payer: Self-pay | Admitting: Emergency Medicine

## 2015-05-06 DIAGNOSIS — E079 Disorder of thyroid, unspecified: Secondary | ICD-10-CM | POA: Insufficient documentation

## 2015-05-06 DIAGNOSIS — Z8719 Personal history of other diseases of the digestive system: Secondary | ICD-10-CM | POA: Diagnosis not present

## 2015-05-06 DIAGNOSIS — Z720 Tobacco use: Secondary | ICD-10-CM | POA: Insufficient documentation

## 2015-05-06 DIAGNOSIS — F319 Bipolar disorder, unspecified: Secondary | ICD-10-CM | POA: Diagnosis not present

## 2015-05-06 DIAGNOSIS — M797 Fibromyalgia: Secondary | ICD-10-CM | POA: Insufficient documentation

## 2015-05-06 DIAGNOSIS — R197 Diarrhea, unspecified: Secondary | ICD-10-CM | POA: Diagnosis not present

## 2015-05-06 DIAGNOSIS — Z8742 Personal history of other diseases of the female genital tract: Secondary | ICD-10-CM | POA: Insufficient documentation

## 2015-05-06 DIAGNOSIS — F419 Anxiety disorder, unspecified: Secondary | ICD-10-CM | POA: Diagnosis not present

## 2015-05-06 DIAGNOSIS — Z3202 Encounter for pregnancy test, result negative: Secondary | ICD-10-CM | POA: Diagnosis not present

## 2015-05-06 DIAGNOSIS — Z79899 Other long term (current) drug therapy: Secondary | ICD-10-CM | POA: Diagnosis not present

## 2015-05-06 DIAGNOSIS — Z7952 Long term (current) use of systemic steroids: Secondary | ICD-10-CM | POA: Insufficient documentation

## 2015-05-06 DIAGNOSIS — R195 Other fecal abnormalities: Secondary | ICD-10-CM | POA: Diagnosis not present

## 2015-05-06 DIAGNOSIS — R109 Unspecified abdominal pain: Secondary | ICD-10-CM | POA: Diagnosis present

## 2015-05-06 LAB — COMPREHENSIVE METABOLIC PANEL
ALT: 14 U/L (ref 14–54)
ANION GAP: 7 (ref 5–15)
AST: 21 U/L (ref 15–41)
Albumin: 4.5 g/dL (ref 3.5–5.0)
Alkaline Phosphatase: 39 U/L (ref 38–126)
BUN: 16 mg/dL (ref 6–20)
CHLORIDE: 101 mmol/L (ref 101–111)
CO2: 31 mmol/L (ref 22–32)
Calcium: 10.8 mg/dL — ABNORMAL HIGH (ref 8.9–10.3)
Creatinine, Ser: 0.81 mg/dL (ref 0.44–1.00)
Glucose, Bld: 118 mg/dL — ABNORMAL HIGH (ref 65–99)
Potassium: 3.4 mmol/L — ABNORMAL LOW (ref 3.5–5.1)
Sodium: 139 mmol/L (ref 135–145)
Total Bilirubin: 0.9 mg/dL (ref 0.3–1.2)
Total Protein: 7.4 g/dL (ref 6.5–8.1)

## 2015-05-06 LAB — TSH: TSH: 1.429 u[IU]/mL (ref 0.350–4.500)

## 2015-05-06 LAB — CBC WITH DIFFERENTIAL/PLATELET
Basophils Absolute: 0 10*3/uL (ref 0.0–0.1)
Basophils Relative: 0 % (ref 0–1)
EOS ABS: 0.1 10*3/uL (ref 0.0–0.7)
EOS PCT: 1 % (ref 0–5)
HCT: 38.1 % (ref 36.0–46.0)
Hemoglobin: 13.2 g/dL (ref 12.0–15.0)
LYMPHS ABS: 2.1 10*3/uL (ref 0.7–4.0)
Lymphocytes Relative: 24 % (ref 12–46)
MCH: 33.3 pg (ref 26.0–34.0)
MCHC: 34.6 g/dL (ref 30.0–36.0)
MCV: 96.2 fL (ref 78.0–100.0)
MONO ABS: 0.6 10*3/uL (ref 0.1–1.0)
Monocytes Relative: 7 % (ref 3–12)
Neutro Abs: 6.2 10*3/uL (ref 1.7–7.7)
Neutrophils Relative %: 68 % (ref 43–77)
PLATELETS: 219 10*3/uL (ref 150–400)
RBC: 3.96 MIL/uL (ref 3.87–5.11)
RDW: 12 % (ref 11.5–15.5)
WBC: 9 10*3/uL (ref 4.0–10.5)

## 2015-05-06 LAB — URINALYSIS, ROUTINE W REFLEX MICROSCOPIC
BILIRUBIN URINE: NEGATIVE
Glucose, UA: NEGATIVE mg/dL
HGB URINE DIPSTICK: NEGATIVE
Ketones, ur: NEGATIVE mg/dL
Leukocytes, UA: NEGATIVE
Nitrite: NEGATIVE
PROTEIN: NEGATIVE mg/dL
Specific Gravity, Urine: >1.03 — ABNORMAL HIGH (ref 1.005–1.030)
Urobilinogen, UA: 1 mg/dL (ref 0.0–1.0)
pH: 6 (ref 5.0–8.0)

## 2015-05-06 LAB — LIPASE, BLOOD: LIPASE: 36 U/L (ref 22–51)

## 2015-05-06 LAB — PREGNANCY, URINE: Preg Test, Ur: NEGATIVE

## 2015-05-06 MED ORDER — KETOROLAC TROMETHAMINE 30 MG/ML IJ SOLN
30.0000 mg | Freq: Once | INTRAMUSCULAR | Status: AC
  Administered 2015-05-06: 30 mg via INTRAVENOUS
  Filled 2015-05-06: qty 1

## 2015-05-06 MED ORDER — SODIUM CHLORIDE 0.9 % IV BOLUS (SEPSIS)
1000.0000 mL | Freq: Once | INTRAVENOUS | Status: AC
  Administered 2015-05-06: 1000 mL via INTRAVENOUS

## 2015-05-06 NOTE — ED Notes (Signed)
Pt c/o nausea, 10/10 cramping abdominal pain with diarrhea starting today. Pt states she has a history of IBS. Pt also states she has noticed blood and blood clots in bowel movements.

## 2015-05-06 NOTE — ED Provider Notes (Signed)
CSN: [id number]     Arrival date & time 05/06/15  1512 History   First MD Initiated Contact with Patient 05/06/15 1529     Chief Complaint  Patient presents with  . Abdominal Pain     (Consider location/radiation/quality/duration/timing/severity/associated sxs/prior Treatment) HPI   Patient is a 39 year old female with fibromyalgia, bipolar and diagnosis of IBS presenting with diarrhea for the last year and half. Patient says that her symptoms have been going on the last year and half to 3 years. Patient saw a gastrologist they did a colonoscopy which was normal. She never followed up. She states that since then she's had occasional diarrhea with cramping and occasional blood associated with diarrhea. However this has going on for the last 3 years. She's noticed no change in amount of blood that she has. It is happens occasionally. She has bright red blood usually. She usually does not have any dark stools. Today she left work because of the cramping  Past Medical History  Diagnosis Date  . Thyroid disease   . Ovarian cyst   . IBS (irritable bowel syndrome)   . Bipolar 1 disorder   . Fibromyalgia   . Anxiety    Past Surgical History  Procedure Laterality Date  . Colonoscopy     No family history on file. Social History  Substance Use Topics  . Smoking status: Current Some Day Smoker  . Smokeless tobacco: None  . Alcohol Use: No   OB History    No data available     Review of Systems  Constitutional: Negative for fever, activity change, fatigue and unexpected weight change.  HENT: Negative for congestion and drooling.   Eyes: Negative for discharge.  Respiratory: Negative for cough, chest tightness and shortness of breath.   Cardiovascular: Negative for chest pain.  Gastrointestinal: Positive for abdominal pain, diarrhea and blood in stool. Negative for nausea, vomiting and abdominal distention.  Genitourinary: Negative for dysuria and difficulty urinating.   Musculoskeletal: Negative for joint swelling.  Skin: Negative for rash.  Allergic/Immunologic: Negative for immunocompromised state.  Neurological: Negative for seizures and speech difficulty.  Psychiatric/Behavioral: Negative for behavioral problems and agitation.      Allergies  Review of patient's allergies indicates no known allergies.  Home Medications   Prior to Admission medications   Medication Sig Start Date End Date Taking? Authorizing Provider  azithromycin (ZITHROMAX) 250 MG tablet Take 250-500 mg by mouth See admin instructions. Take two tablets on day 1` then take on tablet on days 2 through 5    Historical Provider, MD  benzonatate (TESSALON) 100 MG capsule Take 100 mg by mouth 3 (three) times daily as needed for cough.    Historical Provider, MD  clonazePAM (KLONOPIN) 1 MG tablet Take 2 mg by mouth at bedtime.     Historical Provider, MD  ibuprofen (ADVIL,MOTRIN) 800 MG tablet Take 1 tablet (800 mg total) by mouth 3 (three) times daily. Patient not taking: Reported on 01/06/2015 10/07/14   Kathrynn Speed, PA-C  lamoTRIgine (LAMICTAL) 200 MG tablet Take 200 mg by mouth at bedtime.    Historical Provider, MD  levothyroxine (SYNTHROID, LEVOTHROID) 75 MCG tablet 1 po at hs 01/06/15   Ivery Quale, PA-C  lisdexamfetamine (VYVANSE) 70 MG capsule Take 70 mg by mouth every morning.     Historical Provider, MD  loratadine-pseudoephedrine (CLARITIN-D 12 HOUR) 5-120 MG per tablet Take 1 tablet by mouth 2 (two) times daily. 01/06/15   Ivery Quale, PA-C  oseltamivir (TAMIFLU) 75 MG  capsule Take 1 capsule (75 mg total) by mouth every 12 (twelve) hours. Patient not taking: Reported on 01/06/2015 12/15/14   Charm Rings, MD  oxyCODONE-acetaminophen (PERCOCET/ROXICET) 5-325 MG per tablet Take 1-2 tablets by mouth every 4 (four) hours as needed for severe pain. Patient not taking: Reported on 01/06/2015 09/02/13   Raeford Razor, MD  predniSONE (DELTASONE) 20 MG tablet Take 40 mg by mouth daily with  lunch. 5 day supply filled 01/04/15    Historical Provider, MD  traMADol (ULTRAM) 50 MG tablet 1 or 2 po q6h prn pain 01/06/15   Ivery Quale, PA-C   BP 123/77 mmHg  Pulse 91  Temp(Src) 97.6 F (36.4 C) (Oral)  Resp 18  Ht  (1.676 m)  Wt 125 lb (56.7 kg)  BMI 20.19 kg/m2  SpO2 94%  LMP 04/24/2015 Physical Exam  Constitutional: She is oriented to person, place, and time. She appears well-developed and well-nourished.  Thin, well appearing female  HENT:  Head: Normocephalic and atraumatic.  Eyes: Conjunctivae are normal. Right eye exhibits no discharge.  Neck: Neck supple.  Cardiovascular: Normal rate, regular rhythm and normal heart sounds.   No murmur heard. Pulmonary/Chest: Effort normal and breath sounds normal. She has no wheezes. She has no rales.  Abdominal: Soft. She exhibits no distension. There is no tenderness.  Musculoskeletal: Normal range of motion. She exhibits no edema.  Neurological: She is oriented to person, place, and time. No cranial nerve deficit.  Skin: Skin is warm and dry. No rash noted. She is not diaphoretic.  Psychiatric: She has a normal mood and affect. Her behavior is normal.  Nursing note and vitals reviewed.   ED Course  Procedures (including critical care time) Labs Review Labs Reviewed  LIPASE, BLOOD  TSH    Imaging Review No results found. I have personally reviewed and evaluated these images and lab results as part of my medical decision-making.   EKG Interpretation None      MDM   Final diagnoses:  None   patient is a 39 year old female with history of bipolar, fibromyalgia and IBS. She is presenting with 3 years of crampy abdominal pain. Patient states that sometimes is worse than others. Today she had a lot of cramping at work so came here. She has GI follow-up. She just feels like she has not been able to make the time to go to the GI physician . She had a normal colonoscopy within the last 5 years. They were planning to  start her on IBS medications but she never followed up. Patient occasionally has blood in her diarrhea however does not appear to have be any change  recently. I recommended her follow-up with her GI physician within the next week.  She has also multiple other medical concerns today including that she's not taking any of her thyroid medication because she's been unable to follow-up with her regular physician. We will order a TSH day. I warned her that she'll have to return to PCP to follo w up with taht. She is also unhappy with her physician in [place] and would like a new physician here.   We will get labs to make sure that she has normal electrolytes and normal hemoglobin. However given that she's had these problems for 3 years, I do not see any emergency GI consult necessary here in the emergency department.  Patient's physical exam and vital signs are both normal.  Abigail Teall Randall An, MD 05/16/15 [id number]

## 2015-05-06 NOTE — Discharge Instructions (Signed)
Please follow-up with your regular physician. You can find a new regular physician by looking them up online aorcall your insurance company to find one that takes your insurance. Please follow-up with the gastroenterologist that you've already seen.  Please return with any fever or concerning symptoms such as vomiting. Bloody Diarrhea Bloody diarrhea can be caused by many different conditions. Most of the time bloody diarrhea is the result of food poisoning or minor infections. Bloody diarrhea usually improves over 2 to 3 days of rest and fluid replacement. Other conditions that can cause bloody diarrhea include:  Internal bleeding.  Infection.  Diseases of the bowel and colon. Internal bleeding from an ulcer or bowel disease can be severe and requires hospital care or even surgery. DIAGNOSIS  To find out what is wrong your caregiver may check your:  Stool.  Blood.  Results from a test that looks inside the body (endoscopy). TREATMENT   Get plenty of rest.  Drink enough water and fluids to keep your urine clear or pale yellow.  Do not smoke.  Solid foods and dairy products should be avoided until your illness improves.  As you improve, slowly return to a regular diet with easily-digested foods first. Examples are:  Bananas.  Rice.  Toast.  Crackers. You should only need these for about 2 days before adding more normal foods to your diet.  Avoid spicy or fatty foods as well as caffeine and alcohol for several days.  Medicine to control cramping and diarrhea can relieve symptoms but may prolong some cases of bloody diarrhea. Antibiotics can speed recovery from diarrhea due to some bacterial infections. Call your caregiver if diarrhea does not get better in 3 days. SEEK MEDICAL CARE IF:   You do not improve after 3 days.  Your diarrhea improves but your stool appears black. SEEK IMMEDIATE MEDICAL CARE IF:   You become extremely weak or faint.  You become very  sweaty.  You have increased pain or bleeding.  You develop repeated vomiting.  You vomit and you see blood or the vomit looks black in color.  You have a fever. Document Released: 08/20/2005 Document Revised: 11/12/2011 Document Reviewed: 07/22/2009 [facility] Patient Information 2015 [place], [place]. This information is not intended to replace advice given to you by your health care provider. Make sure you discuss any questions you have with your health care provider.

## 2015-05-06 NOTE — ED Notes (Signed)
Pt alert & oriented x4, stable gait. Patient given discharge instructions, paperwork & prescription(s). Patient  instructed to stop at the registration desk to finish any additional paperwork. Patient verbalized understanding. Pt left department w/ no further questions. 

## 2015-05-08 LAB — URINE CULTURE: Culture: NO GROWTH

## 2015-05-14 ENCOUNTER — Emergency Department (HOSPITAL_COMMUNITY): Payer: Medicaid Other

## 2015-05-14 ENCOUNTER — Emergency Department (HOSPITAL_COMMUNITY)
Admission: EM | Admit: 2015-05-14 | Discharge: 2015-05-14 | Disposition: A | Discharge status: Home / Self Care | Payer: Medicaid Other | Attending: Emergency Medicine | Admitting: Emergency Medicine

## 2015-05-14 ENCOUNTER — Encounter (HOSPITAL_COMMUNITY): Payer: Self-pay | Admitting: *Deleted

## 2015-05-14 DIAGNOSIS — M940 Chondrocostal junction syndrome [Tietze]: Secondary | ICD-10-CM | POA: Insufficient documentation

## 2015-05-14 DIAGNOSIS — R0981 Nasal congestion: Secondary | ICD-10-CM | POA: Insufficient documentation

## 2015-05-14 DIAGNOSIS — J3489 Other specified disorders of nose and nasal sinuses: Secondary | ICD-10-CM | POA: Diagnosis not present

## 2015-05-14 DIAGNOSIS — Z8742 Personal history of other diseases of the female genital tract: Secondary | ICD-10-CM | POA: Diagnosis not present

## 2015-05-14 DIAGNOSIS — R071 Chest pain on breathing: Secondary | ICD-10-CM

## 2015-05-14 DIAGNOSIS — F419 Anxiety disorder, unspecified: Secondary | ICD-10-CM | POA: Diagnosis not present

## 2015-05-14 DIAGNOSIS — R05 Cough: Secondary | ICD-10-CM | POA: Diagnosis not present

## 2015-05-14 DIAGNOSIS — R5383 Other fatigue: Secondary | ICD-10-CM | POA: Insufficient documentation

## 2015-05-14 DIAGNOSIS — Z720 Tobacco use: Secondary | ICD-10-CM | POA: Insufficient documentation

## 2015-05-14 DIAGNOSIS — R0602 Shortness of breath: Secondary | ICD-10-CM | POA: Diagnosis not present

## 2015-05-14 DIAGNOSIS — R079 Chest pain, unspecified: Secondary | ICD-10-CM | POA: Diagnosis present

## 2015-05-14 DIAGNOSIS — R0789 Other chest pain: Secondary | ICD-10-CM

## 2015-05-14 DIAGNOSIS — R059 Cough, unspecified: Secondary | ICD-10-CM

## 2015-05-14 DIAGNOSIS — Z8719 Personal history of other diseases of the digestive system: Secondary | ICD-10-CM | POA: Diagnosis not present

## 2015-05-14 DIAGNOSIS — R110 Nausea: Secondary | ICD-10-CM | POA: Insufficient documentation

## 2015-05-14 DIAGNOSIS — E079 Disorder of thyroid, unspecified: Secondary | ICD-10-CM | POA: Diagnosis not present

## 2015-05-14 DIAGNOSIS — F319 Bipolar disorder, unspecified: Secondary | ICD-10-CM | POA: Insufficient documentation

## 2015-05-14 DIAGNOSIS — Z8701 Personal history of pneumonia (recurrent): Secondary | ICD-10-CM | POA: Diagnosis not present

## 2015-05-14 DIAGNOSIS — Z79899 Other long term (current) drug therapy: Secondary | ICD-10-CM | POA: Insufficient documentation

## 2015-05-14 LAB — CBC
HEMATOCRIT: 39.2 % (ref 36.0–46.0)
HEMOGLOBIN: 13.2 g/dL (ref 12.0–15.0)
MCH: 32.7 pg (ref 26.0–34.0)
MCHC: 33.7 g/dL (ref 30.0–36.0)
MCV: 97 fL (ref 78.0–100.0)
Platelets: 234 10*3/uL (ref 150–400)
RBC: 4.04 MIL/uL (ref 3.87–5.11)
RDW: 12.2 % (ref 11.5–15.5)
WBC: 7.5 10*3/uL (ref 4.0–10.5)

## 2015-05-14 LAB — BASIC METABOLIC PANEL
ANION GAP: 8 (ref 5–15)
BUN: 10 mg/dL (ref 6–20)
CHLORIDE: 104 mmol/L (ref 101–111)
CO2: 28 mmol/L (ref 22–32)
Calcium: 9.5 mg/dL (ref 8.9–10.3)
Creatinine, Ser: 0.95 mg/dL (ref 0.44–1.00)
GFR calc non Af Amer: >60 mL/min (ref >60)
GLUCOSE: 126 mg/dL — AB (ref 65–99)
POTASSIUM: 4.1 mmol/L (ref 3.5–5.1)
Sodium: 140 mmol/L (ref 135–145)

## 2015-05-14 LAB — I-STAT TROPONIN, ED: Troponin i, poc: 0 ng/mL (ref 0.00–0.08)

## 2015-05-14 MED ORDER — KETOROLAC TROMETHAMINE 60 MG/2ML IM SOLN
60.0000 mg | Freq: Once | INTRAMUSCULAR | Status: AC
  Administered 2015-05-14: 60 mg via INTRAMUSCULAR
  Filled 2015-05-14: qty 2

## 2015-05-14 MED ORDER — BENZONATATE 100 MG PO CAPS: 100.0000 mg | ORAL_CAPSULE | Freq: Three times a day (TID) | ORAL | Status: DC

## 2015-05-14 MED ORDER — NAPROXEN 500 MG PO TABS: 500.0000 mg | ORAL_TABLET | Freq: Two times a day (BID) | ORAL | Status: DC

## 2015-05-14 MED ORDER — LEVOFLOXACIN 250 MG PO TABS: 750.0000 mg | ORAL_TABLET | Freq: Every day | ORAL | Status: DC

## 2015-05-14 NOTE — ED Notes (Addendum)
Patient walked down the hall oxygen maintain at 98-100..pulse maintain at 88. Patient stated that she felt dizzy while walking.Sarah Morrow

## 2015-05-14 NOTE — Discharge Instructions (Signed)
Take Levaquin as prescribed if you are not going to take Azithromycin. DO NOT TAKE AZITHROMYCIN AND LEVAQUIN TOGETHER; THESE MEDICATIONS SHOULD NOT BE TAKEN IN COMBINATION. Take Tessalon for cough and Naproxen for body aches. Be sure to drink plenty of fluids to prevent dehydration. Follow up with a primary care doctor for a recheck of symptoms.  Cough, Adult  A cough is a reflex that helps clear your throat and airways. It can help heal the body or may be a reaction to an irritated airway. A cough may only last 2 or 3 weeks (acute) or may last more than 8 weeks (chronic).  CAUSES Acute cough:  Viral or bacterial infections. Chronic cough:  Infections.  Allergies.  Asthma.  Post-nasal drip.  Smoking.  Heartburn or acid reflux.  Some medicines.  Chronic lung problems (COPD).  Cancer. SYMPTOMS   Cough.  Fever.  Chest pain.  Increased breathing rate.  High-pitched whistling sound when breathing (wheezing).  Colored mucus that you cough up (sputum). TREATMENT   A bacterial cough may be treated with antibiotic medicine.  A viral cough must run its course and will not respond to antibiotics.  Your caregiver may recommend other treatments if you have a chronic cough. HOME CARE INSTRUCTIONS   Only take over-the-counter or prescription medicines for pain, discomfort, or fever as directed by your caregiver. Use cough suppressants only as directed by your caregiver.  Use a cold steam vaporizer or humidifier in your bedroom or home to help loosen secretions.  Sleep in a semi-upright position if your cough is worse at night.  Rest as needed.  Stop smoking if you smoke. SEEK IMMEDIATE MEDICAL CARE IF:   You have pus in your sputum.  Your cough starts to worsen.  You cannot control your cough with suppressants and are losing sleep.  You begin coughing up blood.  You have difficulty breathing.  You develop pain which is getting worse or is uncontrolled with  medicine.  You have a fever. MAKE SURE YOU:   Understand these instructions.  Will watch your condition.  Will get help right away if you are not doing well or get worse. Document Released: 02/16/2011 Document Revised: 11/12/2011 Document Reviewed: 02/16/2011 [facility] Patient Information 2015 [place], [place]. This information is not intended to replace advice given to you by your health care provider. Make sure you discuss any questions you have with your health care provider.  Costochondritis Costochondritis, sometimes called Tietze syndrome, is a swelling and irritation (inflammation) of the tissue (cartilage) that connects your ribs with your breastbone (sternum). It causes pain in the chest and rib area. Costochondritis usually goes away on its own over time. It can take up to 6 weeks or longer to get better, especially if you are unable to limit your activities. CAUSES  Some cases of costochondritis have no known cause. Possible causes include:  Injury (trauma).  Exercise or activity such as lifting.  Severe coughing. SIGNS AND SYMPTOMS  Pain and tenderness in the chest and rib area.  Pain that gets worse when coughing or taking deep breaths.  Pain that gets worse with specific movements. DIAGNOSIS  Your health care provider will do a physical exam and ask about your symptoms. Chest X-rays or other tests may be done to rule out other problems. TREATMENT  Costochondritis usually goes away on its own over time. Your health care provider may prescribe medicine to help relieve pain. HOME CARE INSTRUCTIONS   Avoid exhausting physical activity. Try not to strain  your ribs during normal activity. This would include any activities using chest, abdominal, and side muscles, especially if heavy weights are used.  Apply ice to the affected area for the first 2 days after the pain begins.  Put ice in a plastic bag.  Place a towel between your skin and the bag.  Leave the ice on  for 20 minutes, 2-3 times a day.  Only take over-the-counter or prescription medicines as directed by your health care provider. SEEK MEDICAL CARE IF:  You have redness or swelling at the rib joints. These are signs of infection.  Your pain does not go away despite rest or medicine. SEEK IMMEDIATE MEDICAL CARE IF:   Your pain increases or you are very uncomfortable.  You have shortness of breath or difficulty breathing.  You cough up blood.  You have worse chest pains, sweating, or vomiting.  You have a fever or persistent symptoms for more than 2-3 days.  You have a fever and your symptoms suddenly get worse. MAKE SURE YOU:   Understand these instructions.  Will watch your condition.  Will get help right away if you are not doing well or get worse. Document Released: 05/30/2005 Document Revised: 06/10/2013 Document Reviewed: 03/24/2013 [facility] Patient Information 2015 [place], [place]. This information is not intended to replace advice given to you by your health care provider. Make sure you discuss any questions you have with your health care provider.

## 2015-05-14 NOTE — ED Provider Notes (Signed)
CSN: [id number]     Arrival date & time 05/14/15  1356 History   First MD Initiated Contact with Patient 05/14/15 2003     Chief Complaint  Patient presents with  . Chest Pain  . Cough     (Consider location/radiation/quality/duration/timing/severity/associated sxs/prior Treatment) HPI Comments: 39 year old female with a history of thyroid disease, IBS, bipolar 1 disorder, and fibromyalgia presents to the emergency department for further evaluation of shortness of breath. Patient states that she has had intermittent shortness of breath over the past 3 weeks with an associated cough productive of white sputum. Patient reports that her symptoms worsened this morning; cough has been worsening over the past week. Patient also began experiencing body aches, fatigue, and nausea. She reports associated nasal congestion and rhinorrhea as well. Symptoms further associated with a central chest pain which is constant and sharp, beginning this morning. Pain is worse with palpation to the chest wall. Patient has had no fever, vomiting, diarrhea, or syncope. She denies any birth control use, recent surgeries, hospitalizations, or travel.   She was evaluated in an urgent care prior to ED presentation today and diagnosed with pneumonia. Patient was treated with 1g of IM Rocephin and discharged with a Z-Pak and instruction to go to and ED for chest pain workup. No medications taken PTA for pain.  Patient is a 38 y.o. female presenting with chest pain and cough. The history is provided by the patient. No language interpreter was used.  Chest Pain Associated symptoms: cough, fatigue, nausea and shortness of breath   Associated symptoms: no fever and not vomiting   Cough Associated symptoms: chest pain and shortness of breath   Associated symptoms: no fever     Past Medical History  Diagnosis Date  . Thyroid disease   . Ovarian cyst   . IBS (irritable bowel syndrome)   . Bipolar 1 disorder   . Fibromyalgia    . Anxiety    Past Surgical History  Procedure Laterality Date  . Colonoscopy     History reviewed. No pertinent family history. Social History  Substance Use Topics  . Smoking status: Current Some Day Smoker  . Smokeless tobacco: None  . Alcohol Use: No   OB History    No data available      Review of Systems  Constitutional: Positive for fatigue. Negative for fever.  Respiratory: Positive for cough and shortness of breath.   Cardiovascular: Positive for chest pain.  Gastrointestinal: Positive for nausea. Negative for vomiting.  Neurological: Negative for syncope.  All other systems reviewed and are negative.   Allergies  Review of patient's allergies indicates no known allergies.  Home Medications   Prior to Admission medications   Medication Sig Start Date End Date Taking? Authorizing Provider  clonazePAM (KLONOPIN) 1 MG tablet Take 1 mg by mouth 4 (four) times daily as needed for anxiety.     Historical Provider, MD  ibuprofen (ADVIL,MOTRIN) 800 MG tablet Take 1 tablet (800 mg total) by mouth 3 (three) times daily. Patient not taking: Reported on 01/06/2015 10/07/14   Kathrynn Speed, PA-C  levothyroxine (SYNTHROID, LEVOTHROID) 75 MCG tablet 1 po at hs Patient taking differently: Take 75 mcg by mouth every morning.  01/06/15   Ivery Quale, PA-C  lisdexamfetamine (VYVANSE) 70 MG capsule Take 70 mg by mouth every morning.     Historical Provider, MD   BP 106/71 mmHg  Pulse 85  Temp(Src) 98.2 F (36.8 C) (Oral)  Resp 19  Ht  (  1.676 m)  Wt 114 lb 4 oz (51.823 kg)  BMI 18.45 kg/m2  SpO2 100%  LMP 04/24/2015   Physical Exam  Constitutional: She is oriented to person, place, and time. She appears well-developed and well-nourished. No distress.  Nontoxic/nonseptic appearing  HENT:  Head: Normocephalic and atraumatic.  Mouth/Throat: Oropharynx is clear and moist. No oropharyngeal exudate.  Eyes: Conjunctivae and EOM are normal. No scleral icterus.  Neck: Normal  range of motion.  Cardiovascular: Normal rate, regular rhythm and intact distal pulses.   Pulmonary/Chest: Effort normal and breath sounds normal. No respiratory distress. She has no wheezes. She has no rales.  No tachypnea or dyspnea. Patient speaking in full sentences. Lungs clear to auscultation bilaterally. No wheezes or rales.  Musculoskeletal: Normal range of motion.  Neurological: She is alert and oriented to person, place, and time. She exhibits normal muscle tone. Coordination normal.  GCS 15. Patient moves extremities without ataxia.  Skin: Skin is warm and dry. No rash noted. She is not diaphoretic. No erythema. No pallor.  Psychiatric: She has a normal mood and affect. Her behavior is normal.  Nursing note and vitals reviewed.   ED Course  Procedures (including critical care time) Labs Review Labs Reviewed  BASIC METABOLIC PANEL - Abnormal; Notable for the following:    Glucose, Bld 126 (*)    All other components within normal limits  CBC  I-STAT TROPOININ, ED    Imaging Review Dg Chest 2 View  05/14/2015   CLINICAL DATA:  Cough and congestion for 3 weeks. Sharp substernal chest pain today.  EXAM: CHEST  2 VIEW  COMPARISON:  01/04/2015  FINDINGS: Cardiomediastinal silhouette is within normal limits. Lungs are well inflated with slightly improved appearance of mild peribronchial thickening bilaterally. No segmental airspace consolidation, edema, pleural effusion, or pneumothorax is seen. No acute osseous abnormality is seen.  IMPRESSION: Mild bronchitic change, slightly improved from prior. No evidence of pneumonia.   Electronically Signed   By: Sebastian Ache M.D.   On: 05/14/2015 21:39     I have personally reviewed and evaluated these images and lab results as part of my medical decision-making.   EKG Interpretation   Date/Time:  Saturday May 14 2015 14:21:03 EDT Ventricular Rate:  101 PR Interval:  118 QRS Duration: 90 QT Interval:  334 QTC Calculation:  433 R Axis:   119 Text Interpretation:  Sinus tachycardia Possible Left atrial enlargement  Right axis deviation Incomplete right bundle branch block Abnormal ECG  Confirmed by Lincoln Brigham [phone]) on 05/14/2015 10:07:06 PM      2040 - Patient walked down the hall oxygen maintain at 98-100% on room air; pulse maintain at 88 MDM   Final diagnoses:  Cough  Costochondral chest pain    39 year old female presents to the emergency department for further evaluation of chest pain. Symptoms associated with a cough, body aches, fatigue, and nausea as well as nasal congestion and rhinorrhea. Patient was diagnosed with pneumonia at urgent care prior to arrival. No fever, leukocytosis, or hypoxia in ED to suggest PNA; xray negative for focal consolidation. Patient ambulatory in the ED without hypoxia as well. Urgent care recommend ED evaluation of chest pain. Doubt ACS as EKG is nonischemic and troponin is negative. Doubt dissection and PE. Patient is PERC negative. Chest pain likely costochondral in nature from cough x 3 weeks.   Patient stable for outpatient management of symptoms. Have prescribed naproxen, Tessalon, and Levaquin as patient desiring antibiotics for previously diagnosed pneumonia from urgent  care today. Primary careful advised and return precautions given. Patient agreeable to plan with no unaddressed concerns. Patient discharged in good condition; VSS.    Antony Madura, PA-C 05/14/15 2210  Tilden Fossa, MD 05/15/15 [phone]

## 2015-05-14 NOTE — ED Notes (Signed)
Pt has multiple complaints, reports pain to entire body including her face. Went to urgent care today and diagnosed with pneumonia and given IM injection and told to come here for further eval. No acute distress noted at triage, ekg done.

## 2016-04-28 ENCOUNTER — Ambulatory Visit (HOSPITAL_COMMUNITY)
Admission: EM | Admit: 2016-04-28 | Discharge: 2016-04-28 | Disposition: A | Discharge status: Home / Self Care | Payer: Medicaid Other | Attending: Radiology | Admitting: Radiology

## 2016-04-28 ENCOUNTER — Encounter (HOSPITAL_COMMUNITY): Payer: Self-pay | Admitting: Emergency Medicine

## 2016-04-28 DIAGNOSIS — J069 Acute upper respiratory infection, unspecified: Secondary | ICD-10-CM | POA: Diagnosis not present

## 2016-04-28 MED ORDER — TRIAMCINOLONE ACETONIDE 40 MG/ML IJ SUSP
40.0000 mg | Freq: Once | INTRAMUSCULAR | Status: AC
  Administered 2016-04-28: 40 mg via INTRAMUSCULAR

## 2016-04-28 MED ORDER — FLUTICASONE PROPIONATE 50 MCG/ACT NA SUSP: 1.0000 | Freq: Every day | NASAL | 2 refills | Status: DC

## 2016-04-28 MED ORDER — PREDNISONE 5 MG (21) PO TBPK: 5.0000 mg | ORAL_TABLET | Freq: Every day | ORAL | 0 refills | Status: DC

## 2016-04-28 MED ORDER — TRIAMCINOLONE ACETONIDE 40 MG/ML IJ SUSP
INTRAMUSCULAR | Status: AC
  Filled 2016-04-28: qty 1

## 2016-04-28 MED ORDER — AMOXICILLIN-POT CLAVULANATE ER 1000-62.5 MG PO TB12: 2.0000 | ORAL_TABLET | Freq: Two times a day (BID) | ORAL | 0 refills | Status: AC

## 2016-04-28 MED ORDER — FLUTICASONE PROPIONATE 50 MCG/ACT NA SUSP
1.0000 | Freq: Every day | NASAL | 2 refills | Status: DC
Start: 2016-04-28 — End: 2016-04-28

## 2016-04-28 NOTE — ED Notes (Signed)
Patient in bathroom, collecting specimen Patient not in treatment room

## 2016-04-28 NOTE — ED Provider Notes (Signed)
CSN: [id number]     Arrival date & time 04/28/16  1601 History   None    Chief Complaint  Patient presents with  . URI   (Consider location/radiation/quality/duration/timing/severity/associated sxs/prior Treatment) 40 y.o. female presents with nonproductive cough, congestion, ear pressure and fever X 3 days  Condition is made better by nothing. Condition is made worse by worse by nothing. Patient denies any treatment prior to there arrival at this facility. Patient denies any known sick contacts. Patient states that she has a history of "getting pna and sinus infections this time of year"       Past Medical History:  Diagnosis Date  . Anxiety   . Bipolar 1 disorder (HCC)   . Fibromyalgia   . IBS (irritable bowel syndrome)   . Ovarian cyst   . Thyroid disease    Past Surgical History:  Procedure Laterality Date  . COLONOSCOPY     History reviewed. No pertinent family history. Social History  Substance Use Topics  . Smoking status: Current Some Day Smoker  . Smokeless tobacco: Never Used  . Alcohol use No   OB History    No data available     Review of Systems  Constitutional: Positive for fever.  HENT: Positive for congestion, ear pain and sinus pressure.   Respiratory: Positive for cough (nonproductive).     Allergies  Review of patient's allergies indicates no known allergies.  Home Medications   Prior to Admission medications   Medication Sig Start Date End Date Taking? Authorizing Provider  clonazePAM (KLONOPIN) 1 MG tablet Take 1 mg by mouth 4 (four) times daily as needed for anxiety.    Yes Historical Provider, MD  lisdexamfetamine (VYVANSE) 70 MG capsule Take 70 mg by mouth every morning.    Yes Historical Provider, MD  amoxicillin-clavulanate (AUGMENTIN XR) 1000-62.5 MG 12 hr tablet Take 2 tablets by mouth 2 (two) times daily. 04/28/16 05/08/16  Alene MiresJennifer C Sheneika Walstad, NP  benzonatate (TESSALON) 100 MG capsule Take 1 capsule (100 mg total) by mouth every 8  (eight) hours. 05/14/15   Antony MaduraKelly Humes, PA-C  fluticasone (FLONASE) 50 MCG/ACT nasal spray Place 1 spray into both nostrils daily. 04/28/16   Alene MiresJennifer C Jaheim Canino, NP  ibuprofen (ADVIL,MOTRIN) 800 MG tablet Take 1 tablet (800 mg total) by mouth 3 (three) times daily. Patient not taking: Reported on 01/06/2015 10/07/14   Kathrynn Speedobyn M Hess, PA-C  levofloxacin (LEVAQUIN) 250 MG tablet Take 3 tablets (750 mg total) by mouth daily. 05/14/15   Antony MaduraKelly Humes, PA-C  levothyroxine (SYNTHROID, LEVOTHROID) 75 MCG tablet 1 po at hs Patient taking differently: Take 75 mcg by mouth every morning.  01/06/15   Ivery QualeHobson Bryant, PA-C  naproxen (NAPROSYN) 500 MG tablet Take 1 tablet (500 mg total) by mouth 2 (two) times daily. 05/14/15   Antony MaduraKelly Humes, PA-C  predniSONE (STERAPRED UNI-PAK 21 TAB) 5 MG (21) TBPK tablet Take 1 tablet (5 mg total) by mouth daily. 04/28/16   Alene MiresJennifer C Edwardine Deschepper, NP   Meds Ordered and Administered this Visit   Medications  triamcinolone acetonide (KENALOG-40) injection 40 mg (not administered)    BP 100/66 (BP Location: Right Arm)   Pulse 78   Temp 98.5 F (36.9 C) (Oral)   Resp 12   SpO2 100%  No data found.   Physical Exam  Constitutional: She is oriented to person, place, and time. She appears well-developed and well-nourished.  HENT:  Nasal congestion with post nasal drip noted.   Cardiovascular: Normal rate and regular  rhythm.   Pulmonary/Chest: Effort normal and breath sounds normal.  Neurological: She is alert and oriented to person, place, and time.  Skin: Skin is warm and dry.    Urgent Care Course   Clinical Course    Procedures (including critical care time)  Labs Review Labs Reviewed - No data to display  Imaging Review No results found.   Visual Acuity Review  Right Eye Distance:   Left Eye Distance:   Bilateral Distance:    Right Eye Near:   Left Eye Near:    Bilateral Near:         MDM   1. URI (upper respiratory infection)       Alene Mires, NP 04/28/16 1755

## 2016-04-28 NOTE — Discharge Instructions (Signed)
Continue to push fluids

## 2016-04-28 NOTE — ED Triage Notes (Signed)
C/o cold sx onset x3 days associated w/HA, bilateral ear pain, prod cough, chills, BAs  A&O x4... NAD

## 2016-07-17 ENCOUNTER — Encounter (HOSPITAL_COMMUNITY): Payer: Self-pay

## 2016-07-17 ENCOUNTER — Emergency Department (HOSPITAL_COMMUNITY): Payer: Medicaid Other

## 2016-07-17 ENCOUNTER — Emergency Department (HOSPITAL_COMMUNITY)
Admission: EM | Admit: 2016-07-17 | Discharge: 2016-07-17 | Disposition: A | Discharge status: Home / Self Care | Payer: Medicaid Other | Attending: Emergency Medicine | Admitting: Emergency Medicine

## 2016-07-17 DIAGNOSIS — R350 Frequency of micturition: Secondary | ICD-10-CM | POA: Diagnosis not present

## 2016-07-17 DIAGNOSIS — F17200 Nicotine dependence, unspecified, uncomplicated: Secondary | ICD-10-CM | POA: Diagnosis not present

## 2016-07-17 DIAGNOSIS — J069 Acute upper respiratory infection, unspecified: Secondary | ICD-10-CM | POA: Diagnosis not present

## 2016-07-17 DIAGNOSIS — R05 Cough: Secondary | ICD-10-CM | POA: Diagnosis present

## 2016-07-17 LAB — URINALYSIS, ROUTINE W REFLEX MICROSCOPIC
Bilirubin Urine: NEGATIVE
Glucose, UA: NEGATIVE mg/dL
Hgb urine dipstick: NEGATIVE
Ketones, ur: NEGATIVE mg/dL
Leukocytes, UA: NEGATIVE
Nitrite: NEGATIVE
Protein, ur: NEGATIVE mg/dL
Specific Gravity, Urine: 1.024 (ref 1.005–1.030)
pH: 5.5 (ref 5.0–8.0)

## 2016-07-17 MED ORDER — IBUPROFEN 400 MG PO TABS
800.0000 mg | ORAL_TABLET | Freq: Once | ORAL | Status: AC
  Administered 2016-07-17: 800 mg via ORAL
  Filled 2016-07-17: qty 2

## 2016-07-17 MED ORDER — IBUPROFEN 800 MG PO TABS: 800.0000 mg | ORAL_TABLET | Freq: Three times a day (TID) | ORAL | 0 refills | Status: DC

## 2016-07-17 MED ORDER — ALBUTEROL SULFATE HFA 108 (90 BASE) MCG/ACT IN AERS: 1.0000 | INHALATION_SPRAY | Freq: Four times a day (QID) | RESPIRATORY_TRACT | 0 refills | Status: DC | PRN

## 2016-07-17 MED ORDER — CETIRIZINE-PSEUDOEPHEDRINE ER 5-120 MG PO TB12: 1.0000 | ORAL_TABLET | Freq: Two times a day (BID) | ORAL | 0 refills | Status: DC

## 2016-07-17 MED ORDER — BENZONATATE 100 MG PO CAPS: 100.0000 mg | ORAL_CAPSULE | Freq: Three times a day (TID) | ORAL | 0 refills | Status: DC

## 2016-07-17 NOTE — Discharge Instructions (Signed)
Medications: Ibuprofen, albuterol inhaler, Tessalon, Zyrtec-D  Treatment: Take ibuprofen every 8 hours as needed for body aches and pain. Use albuterol inhaler every 4-6 hours as needed for cough and/or shortness of breath. Take Tessalon every 8 hours as needed for cough. Take Zyrtec-D twice daily for your nasal congestion.  Follow-up: Please follow-up with your primary care provider as you had planned. If your symptoms are not resolving over the next 7-10 days, also follow up with your primary care provider. Please return to emergency department if you develop any new or worsening symptoms.

## 2016-07-17 NOTE — ED Triage Notes (Signed)
Patient complains of cough and congestion with bilateral ear pain and body aches x 1 day, denies fever. NAD

## 2016-07-17 NOTE — ED Notes (Signed)
Patient advised all symptoms started this morning.  Patient states "I have to be in court this morning, but I couldn't".
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
## 2020-09-26 ENCOUNTER — Telehealth: Payer: Self-pay | Admitting: Family Medicine

## 2020-09-27 ENCOUNTER — Encounter (HOSPITAL_COMMUNITY): Payer: Self-pay | Admitting: *Deleted

## 2020-09-27 ENCOUNTER — Other Ambulatory Visit: Payer: Self-pay

## 2020-09-27 ENCOUNTER — Emergency Department (HOSPITAL_COMMUNITY)
Admission: EM | Admit: 2020-09-27 | Discharge: 2020-09-27 | Disposition: A | Discharge status: Home / Self Care | Payer: Medicaid Other | Attending: Emergency Medicine | Admitting: Emergency Medicine

## 2020-09-27 DIAGNOSIS — B349 Viral infection, unspecified: Secondary | ICD-10-CM | POA: Insufficient documentation

## 2020-09-27 DIAGNOSIS — Z20822 Contact with and (suspected) exposure to covid-19: Secondary | ICD-10-CM | POA: Insufficient documentation

## 2020-09-27 DIAGNOSIS — R509 Fever, unspecified: Secondary | ICD-10-CM | POA: Diagnosis present

## 2020-09-27 DIAGNOSIS — F17200 Nicotine dependence, unspecified, uncomplicated: Secondary | ICD-10-CM | POA: Insufficient documentation

## 2020-09-27 DIAGNOSIS — Z1152 Encounter for screening for COVID-19: Secondary | ICD-10-CM

## 2020-09-27 LAB — SARS CORONAVIRUS 2 (TAT 6-24 HRS): SARS Coronavirus 2: NEGATIVE

## 2020-09-27 NOTE — ED Notes (Signed)
Patient went to her car to get her charger

## 2020-09-27 NOTE — Discharge Instructions (Signed)
You were tested for COVID-19 today, make sure to quarantine until you receive the results.  You may follow-up on these results via the MyChart app.  There is instructions in your discharge paperwork on how to develop this.  If this is positive, please make sure to quarantine additional 7 to 10 days. You may take ibuprofen/Tylenol for body aches and fevers, drink plenty of fluids, over-the-counter cold and flu medications. You may also buy an over-the-counter pulse oximeter which can be found at your local pharmacy. If your oxygen saturation drops below 90%, please make sure to return to the ER. Return to the ER for any worsening shortness of breath.

## 2020-09-27 NOTE — ED Provider Notes (Signed)
[facility] EMERGENCY DEPARTMENT Provider Note   CSN: [id number] Arrival date & time: 09/27/20  1231     History No chief complaint on file.   Sarah Morrow is a 45 y.o. female.  HPI 45 year old female with a history bipolar 1 disorder, fibromyalgia, thyroid disease, anxiety presents to the ER with complaints of having night sweats, fevers, cough and low back pain x5 days.  She is concerned that this may be COVID-19.  She is not vaccinated and states that she will "not get it".  She denies any dysuria or hematuria.  Endorses a mild headache with no vision changes, numbness, tingling, facial droop.  She denies any history of IV drug use.  Denies any dysuria or hematuria.  No vaginal bleeding or discharge.  She would like to get a Covid test.  No known fevers, chest pain or shortness of breath.  She states that her mother recently had Covid as well.  Denies any numbness or tingling in her extremities, no foot drop.  No unintended weight loss.    Past Medical History:  Diagnosis Date  . Anxiety   . Bipolar 1 disorder (HCC)   . Fibromyalgia   . IBS (irritable bowel syndrome)   . Ovarian cyst   . Thyroid disease     There are no problems to display for this patient.   Past Surgical History:  Procedure Laterality Date  . COLONOSCOPY       OB History   No obstetric history on file.     No family history on file.  Social History   Tobacco Use  . Smoking status: Current Some Day Smoker  . Smokeless tobacco: Never Used  Substance Use Topics  . Alcohol use: No  . Drug use: No    Home Medications Prior to Admission medications   Medication Sig Start Date End Date Taking? Authorizing Provider  albuterol (PROVENTIL HFA;VENTOLIN HFA) 108 (90 Base) MCG/ACT inhaler Inhale 1-2 puffs into the lungs every 6 (six) hours as needed for wheezing or shortness of breath. 07/17/16   Law, Waylan Boga, PA-C  benzonatate (TESSALON) 100 MG capsule Take 1 capsule (100 mg  total) by mouth every 8 (eight) hours. 07/17/16   Law, Waylan Boga, PA-C  cetirizine-pseudoephedrine (ZYRTEC-D) 5-120 MG tablet Take 1 tablet by mouth 2 (two) times daily. 07/17/16   Law, Waylan Boga, PA-C  clonazePAM (KLONOPIN) 1 MG tablet Take 1 mg by mouth 4 (four) times daily as needed for anxiety.     [provider]  fluticasone (FLONASE) 50 MCG/ACT nasal spray Place 1 spray into both nostrils daily. 04/28/16   Alene Mires, NP  ibuprofen (ADVIL,MOTRIN) 800 MG tablet Take 1 tablet (800 mg total) by mouth 3 (three) times daily. 07/17/16   Law, Waylan Boga, PA-C  levofloxacin (LEVAQUIN) 250 MG tablet Take 3 tablets (750 mg total) by mouth daily. 05/14/15   Antony Madura, PA-C  levothyroxine (SYNTHROID, LEVOTHROID) 75 MCG tablet 1 po at hs Patient taking differently: Take 75 mcg by mouth every morning.  01/06/15   Ivery Quale, PA-C  lisdexamfetamine (VYVANSE) 70 MG capsule Take 70 mg by mouth every morning.     [provider]  naproxen (NAPROSYN) 500 MG tablet Take 1 tablet (500 mg total) by mouth 2 (two) times daily. 05/14/15   Antony Madura, PA-C  predniSONE (STERAPRED UNI-PAK 21 TAB) 5 MG (21) TBPK tablet Take 1 tablet (5 mg total) by mouth daily. 04/28/16   Alene Mires, NP  sodium chloride (OCEAN) 0.65 % SOLN nasal spray Place 1 spray into both nostrils as needed for congestion. 10/07/14 12/15/14  Hess, Nada Boozer, PA-C    Allergies    Patient has no known allergies.  Review of Systems   Review of Systems  Constitutional: Positive for activity change, appetite change, chills, fatigue and fever.  Respiratory: Negative for cough and shortness of breath.   Cardiovascular: Negative for chest pain.  Gastrointestinal: Negative for abdominal pain, diarrhea, nausea and vomiting.  Genitourinary: Negative for dysuria.  Musculoskeletal: Positive for back pain. Negative for gait problem, neck pain and neck stiffness.  Neurological: Positive for weakness and headaches.  Negative for dizziness, facial asymmetry and light-headedness.    Physical Exam Updated Vital Signs BP 123/89 (BP Location: Right Arm)   Pulse 98   Temp 97.8 F (36.6 C) (Oral)   Resp 18   LMP 09/22/2020 (Approximate)   SpO2 100%   Physical Exam Vitals and nursing note reviewed.  Constitutional:      General: She is not in acute distress.    Appearance: She is well-developed and well-nourished.     Comments: Well-appearing, speaking full sentences without increased work of breathing  HENT:     Head: Normocephalic and atraumatic.  Eyes:     Conjunctiva/sclera: Conjunctivae normal.  Cardiovascular:     Rate and Rhythm: Normal rate and regular rhythm.     Heart sounds: No murmur heard.   Pulmonary:     Effort: Pulmonary effort is normal. No respiratory distress.     Breath sounds: Normal breath sounds.  Abdominal:     Palpations: Abdomen is soft.     Tenderness: There is no abdominal tenderness. There is no right CVA tenderness or left CVA tenderness.  Musculoskeletal:        General: No swelling, tenderness, deformity, signs of injury or edema. Normal range of motion.     Cervical back: Neck supple.     Right lower leg: No edema.     Left lower leg: No edema.     Comments: Moving all 4 extremities without difficulty, no midline tenderness, neurovascularly intact.  Ambulated in the ED without difficulty.  No significant flank tenderness on exam.  Skin:    General: Skin is warm and dry.  Neurological:     General: No focal deficit present.     Mental Status: She is alert and oriented to person, place, and time.  Psychiatric:        Mood and Affect: Mood and affect normal.     ED Results / Procedures / Treatments   Labs (all labs ordered are listed, but only abnormal results are displayed) Labs Reviewed  SARS CORONAVIRUS 2 (TAT 6-24 HRS)  URINALYSIS, ROUTINE W REFLEX MICROSCOPIC    EKG None  Radiology No results found.  Procedures Procedures   Medications  Ordered in ED Medications - No data to display  ED Course  I have reviewed the triage vital signs and the nursing notes.  Pertinent labs & imaging results that were available during my care of the patient were reviewed by me and considered in my medical decision making (see chart for details).    MDM Rules/Calculators/A&P                          45 year old female presents to the ER with fevers, body aches, low back pain.  She is not vaccinated.  Overall very well-appearing.  Vitals on arrival very  reassuring, afebrile, not tachycardic, tachypneic or hypoxic.  She has no neurologic deficits on exam, no midline tenderness to her low back.  No CVA tenderness.  Low suspicion for malignancy, not equina, pyelonephritis, renal stones.  She was swabbed for COVID-19 here, encouraged to follow-up on the results via MyChart app.  He inquired about hydroxychloroquine, encouraged her not to take this medicine for COVID-19 as this is not approved by the FDA.  Encouraged supportive care, return precautions.  She voiced understanding and is agreeable.  At this stage in the ED course, the patient is medically screened and stable for discharge.   Final Clinical Impression(s) / ED Diagnoses Final diagnoses:  Viral syndrome  Encounter for screening for COVID-19    Rx / DC Orders ED Discharge Orders    None       Mare Ferrari, PA-C 09/27/20 1641    Horton, Clabe Seal, DO 09/29/20 0830

## 2020-09-27 NOTE — ED Triage Notes (Signed)
Pt states she has been in the bed sweating and really tired. No coughing or vomiting. Pt has pain to right lower back. No sore throat, reports mild headache.

## 2020-11-05 ENCOUNTER — Encounter (HOSPITAL_COMMUNITY): Payer: Self-pay

## 2020-11-05 ENCOUNTER — Other Ambulatory Visit: Payer: Self-pay

## 2020-11-05 ENCOUNTER — Emergency Department (HOSPITAL_COMMUNITY)
Admission: EM | Admit: 2020-11-05 | Discharge: 2020-11-05 | Disposition: A | Discharge status: Home / Self Care | Payer: Medicaid Other | Attending: Emergency Medicine | Admitting: Emergency Medicine

## 2020-11-05 DIAGNOSIS — K047 Periapical abscess without sinus: Secondary | ICD-10-CM | POA: Insufficient documentation

## 2020-11-05 DIAGNOSIS — F17200 Nicotine dependence, unspecified, uncomplicated: Secondary | ICD-10-CM | POA: Diagnosis not present

## 2020-11-05 DIAGNOSIS — R000 Tachycardia, unspecified: Secondary | ICD-10-CM | POA: Diagnosis not present

## 2020-11-05 DIAGNOSIS — K0889 Other specified disorders of teeth and supporting structures: Secondary | ICD-10-CM | POA: Diagnosis present

## 2020-11-05 MED ORDER — HYDROCODONE-ACETAMINOPHEN 5-325 MG PO TABS
1.0000 | ORAL_TABLET | Freq: Four times a day (QID) | ORAL | 0 refills | Status: DC | PRN
Start: 2020-11-05 — End: 2022-07-19

## 2020-11-05 MED ORDER — CLINDAMYCIN HCL 150 MG PO CAPS: 150.0000 mg | ORAL_CAPSULE | Freq: Four times a day (QID) | ORAL | 0 refills | Status: DC

## 2020-11-05 NOTE — ED Provider Notes (Signed)
[facility]-EMERGENCY DEPT Provider Note   CSN: [id number] Arrival date & time: 11/05/20  1527     History Chief Complaint  Patient presents with  . Facial Swelling  . Dental Pain    Sarah Morrow is a 45 y.o. female.  The history is provided by the patient.  Dental Pain Location:  Upper Upper teeth location:  12/LU 1st bicuspid Quality:  Localized, pulsating, sharp, shooting and throbbing Severity:  Severe Onset quality:  Gradual Timing:  Constant Progression:  Worsening Chronicity:  Recurrent Context: abscess, dental caries, dental fracture and poor dentition   Relieved by:  Nothing Worsened by:  Cold food/drink, hot food/drink, touching and jaw movement Ineffective treatments:  NSAIDs and acetaminophen Associated symptoms: facial pain, facial swelling and gum swelling   Associated symptoms: no difficulty swallowing, no fever and no neck swelling   Associated symptoms comment:  Yesterday started having left sided facial swelling.  Worse today but last night pus and blood drained from the tooth.  No pain with left eye movement or blurry vision Risk factors: lack of dental care, periodontal disease and smoking        Past Medical History:  Diagnosis Date  . Anxiety   . Bipolar 1 disorder (HCC)   . Fibromyalgia   . IBS (irritable bowel syndrome)   . Ovarian cyst   . Thyroid disease     There are no problems to display for this patient.   Past Surgical History:  Procedure Laterality Date  . COLONOSCOPY       OB History   No obstetric history on file.     History reviewed. No pertinent family history.  Social History   Tobacco Use  . Smoking status: Current Some Day Smoker  . Smokeless tobacco: Never Used  Substance Use Topics  . Alcohol use: No  . Drug use: No    Home Medications Prior to Admission medications   Medication Sig Start Date End Date Taking? Authorizing Provider  clindamycin (CLEOCIN) 150 MG capsule Take 1  capsule (150 mg total) by mouth every 6 (six) hours. 11/05/20  Yes Gwyneth Sprout, MD  HYDROcodone-acetaminophen (NORCO/VICODIN) 5-325 MG tablet Take 1 tablet by mouth every 6 (six) hours as needed for severe pain. 11/05/20  Yes Gwyneth Sprout, MD  albuterol (PROVENTIL HFA;VENTOLIN HFA) 108 (90 Base) MCG/ACT inhaler Inhale 1-2 puffs into the lungs every 6 (six) hours as needed for wheezing or shortness of breath. 07/17/16   Law, Waylan Boga, PA-C  benzonatate (TESSALON) 100 MG capsule Take 1 capsule (100 mg total) by mouth every 8 (eight) hours. 07/17/16   Law, Waylan Boga, PA-C  cetirizine-pseudoephedrine (ZYRTEC-D) 5-120 MG tablet Take 1 tablet by mouth 2 (two) times daily. 07/17/16   Law, Waylan Boga, PA-C  clonazePAM (KLONOPIN) 1 MG tablet Take 1 mg by mouth 4 (four) times daily as needed for anxiety.     [provider]  fluticasone (FLONASE) 50 MCG/ACT nasal spray Place 1 spray into both nostrils daily. 04/28/16   Alene Mires, NP  ibuprofen (ADVIL,MOTRIN) 800 MG tablet Take 1 tablet (800 mg total) by mouth 3 (three) times daily. 07/17/16   Law, Waylan Boga, PA-C  levofloxacin (LEVAQUIN) 250 MG tablet Take 3 tablets (750 mg total) by mouth daily. 05/14/15   Antony Madura, PA-C  levothyroxine (SYNTHROID, LEVOTHROID) 75 MCG tablet 1 po at hs Patient taking differently: Take 75 mcg by mouth every morning.  01/06/15   Ivery Quale, PA-C  lisdexamfetamine (VYVANSE) 70 MG  capsule Take 70 mg by mouth every morning.     [provider]  naproxen (NAPROSYN) 500 MG tablet Take 1 tablet (500 mg total) by mouth 2 (two) times daily. 05/14/15   Antony Madura, PA-C  predniSONE (STERAPRED UNI-PAK 21 TAB) 5 MG (21) TBPK tablet Take 1 tablet (5 mg total) by mouth daily. 04/28/16   Alene Mires, NP  sodium chloride (OCEAN) 0.65 % SOLN nasal spray Place 1 spray into both nostrils as needed for congestion. 10/07/14 12/15/14  Hess, Nada Boozer, PA-C    Allergies    Patient has no known  allergies.  Review of Systems   Review of Systems  Constitutional: Negative for fever.  HENT: Positive for facial swelling.   All other systems reviewed and are negative.   Physical Exam Updated Vital Signs BP 119/81 (BP Location: Left Arm)   Pulse (!) 102   Temp 98.3 F (36.8 C) (Oral)   Resp 16   Ht 5\' 4"  (1.626 m)   Wt 62.8 kg   SpO2 97%   BMI 23.75 kg/m   Physical Exam Vitals and nursing note reviewed.  Constitutional:      General: She is not in acute distress.    Appearance: Normal appearance. She is normal weight.  HENT:     Nose: Nose normal.     Mouth/Throat:     Mouth: Mucous membranes are moist.   Eyes:     Extraocular Movements: Extraocular movements intact.     Pupils: Pupils are equal, round, and reactive to light.     Comments: No pain with eye movement  Cardiovascular:     Rate and Rhythm: Tachycardia present.     Pulses: Normal pulses.  Pulmonary:     Effort: Pulmonary effort is normal.  Musculoskeletal:     Cervical back: Normal range of motion.  Skin:    General: Skin is warm and dry.  Neurological:     General: No focal deficit present.     Mental Status: She is alert and oriented to person, place, and time. Mental status is at baseline.  Psychiatric:        Mood and Affect: Mood normal.        Behavior: Behavior normal.     ED Results / Procedures / Treatments   Labs (all labs ordered are listed, but only abnormal results are displayed) Labs Reviewed - No data to display  EKG None  Radiology No results found.  Procedures Procedures   Medications Ordered in ED Medications - No data to display  ED Course  I have reviewed the triage vital signs and the nursing notes.  Pertinent labs & imaging results that were available during my care of the patient were reviewed by me and considered in my medical decision making (see chart for details).    MDM Rules/Calculators/A&P                          Pt with dental caries and  facial swelling.  No signs of ludwig's angina or difficulty swallowing and no systemic symptoms.  No signs of orbital cellulitis. Will treat with clinda and have pt f/u with dentist Monday as planned.  Final Clinical Impression(s) / ED Diagnoses Final diagnoses:  Dental abscess    Rx / DC Orders ED Discharge Orders         Ordered    clindamycin (CLEOCIN) 150 MG capsule  Every 6 hours  11/05/20 1607    HYDROcodone-acetaminophen (NORCO/VICODIN) 5-325 MG tablet  Every 6 hours PRN        11/05/20 1607           Gwyneth Sprout, MD 11/05/20 1646

## 2020-11-05 NOTE — ED Triage Notes (Signed)
Pt reports that she has a dental abscess and started noticing swelling of her face yesterday. Pts left side of face is visibly swollen at this time.

## 2020-11-05 NOTE — Discharge Instructions (Signed)
Try heat to the area to help drainage

## 2021-12-03 ENCOUNTER — Other Ambulatory Visit: Payer: Self-pay

## 2021-12-03 ENCOUNTER — Emergency Department (HOSPITAL_COMMUNITY): Payer: Medicaid Other

## 2021-12-03 ENCOUNTER — Encounter (HOSPITAL_COMMUNITY): Payer: Self-pay | Admitting: Emergency Medicine

## 2021-12-03 ENCOUNTER — Emergency Department (HOSPITAL_COMMUNITY)
Admission: EM | Admit: 2021-12-03 | Discharge: 2021-12-03 | Disposition: A | Discharge status: Home / Self Care | Payer: Medicaid Other | Attending: Emergency Medicine | Admitting: Emergency Medicine

## 2021-12-03 DIAGNOSIS — R059 Cough, unspecified: Secondary | ICD-10-CM | POA: Diagnosis present

## 2021-12-03 DIAGNOSIS — J209 Acute bronchitis, unspecified: Secondary | ICD-10-CM | POA: Diagnosis not present

## 2021-12-03 DIAGNOSIS — J40 Bronchitis, not specified as acute or chronic: Secondary | ICD-10-CM

## 2021-12-03 DIAGNOSIS — R051 Acute cough: Secondary | ICD-10-CM

## 2021-12-03 HISTORY — DX: Bronchitis, not specified as acute or chronic: J40

## 2021-12-03 MED ORDER — AZITHROMYCIN 250 MG PO TABS: 250.0000 mg | ORAL_TABLET | Freq: Every day | ORAL | 0 refills | Status: AC

## 2021-12-03 MED ORDER — AZITHROMYCIN 250 MG PO TABS
500.0000 mg | ORAL_TABLET | Freq: Once | ORAL | Status: AC
  Administered 2021-12-03: 500 mg via ORAL
  Filled 2021-12-03: qty 2

## 2021-12-03 MED ORDER — ALBUTEROL SULFATE HFA 108 (90 BASE) MCG/ACT IN AERS
2.0000 | INHALATION_SPRAY | RESPIRATORY_TRACT | Status: DC
  Administered 2021-12-03: 2 via RESPIRATORY_TRACT
  Filled 2021-12-03: qty 6.7

## 2021-12-03 NOTE — ED Notes (Signed)
ED Provider at bedside. 

## 2021-12-03 NOTE — ED Triage Notes (Signed)
Pt reports cough with chest congestion and Sob x 3 days, reports hx of bronchitis  ?

## 2021-12-03 NOTE — ED Provider Notes (Signed)
?[facility] EMERGENCY DEPARTMENT ?Provider Note ? ? ?CSN: [id number] ?Arrival date & time: 12/03/21  2033 ? ?  ? ?History ? ?Chief Complaint  ?Patient presents with  ? Cough  ? ? ?Sarah Morrow is a 46 y.o. female. ? ?Patient indicates non prod cough, congestion in the past 3-4 days. Symptoms acute onset, moderate, persistent. No sore throat or trouble swallowing. No chest pain or discomfort. No sob. No headache. No neck pain or stiffness. No leg pain or swelling. No specific known ill contacts or known flu or covid exposure. +smoker. Notes hx bronchitis and feels similar.  ? ?The history is provided by the patient and medical records.  ?Cough ?Associated symptoms: no chest pain, no fever, no headaches, no rash, no shortness of breath and no sore throat   ? ?  ? ?Home Medications ?Prior to Admission medications   ?Medication Sig Start Date End Date Taking? Authorizing Provider  ?azithromycin (ZITHROMAX Z-PAK) 250 MG tablet Take 1 tablet (250 mg total) by mouth daily for 4 days. 12/04/21 12/08/21 Yes Cathren LaineSteinl, Esaiah Wanless, MD  ?albuterol (PROVENTIL HFA;VENTOLIN HFA) 108 (90 Base) MCG/ACT inhaler Inhale 1-2 puffs into the lungs every 6 (six) hours as needed for wheezing or shortness of breath. 07/17/16   Emi HolesLaw, Alexandra M, PA-C  ?benzonatate (TESSALON) 100 MG capsule Take 1 capsule (100 mg total) by mouth every 8 (eight) hours. 07/17/16   Emi HolesLaw, Alexandra M, PA-C  ?cetirizine-pseudoephedrine (ZYRTEC-D) 5-120 MG tablet Take 1 tablet by mouth 2 (two) times daily. 07/17/16   Emi HolesLaw, Alexandra M, PA-C  ?clindamycin (CLEOCIN) 150 MG capsule Take 1 capsule (150 mg total) by mouth every 6 (six) hours. 11/05/20   Gwyneth SproutPlunkett, Whitney, MD  ?clonazePAM (KLONOPIN) 1 MG tablet Take 1 mg by mouth 4 (four) times daily as needed for anxiety.     [provider]  ?fluticasone (FLONASE) 50 MCG/ACT nasal spray Place 1 spray into both nostrils daily. 04/28/16   Alene Miresmohundro, Jennifer C, NP  ?HYDROcodone-acetaminophen (NORCO/VICODIN) 5-325 MG tablet Take 1  tablet by mouth every 6 (six) hours as needed for severe pain. 11/05/20   Gwyneth SproutPlunkett, Whitney, MD  ?ibuprofen (ADVIL,MOTRIN) 800 MG tablet Take 1 tablet (800 mg total) by mouth 3 (three) times daily. 07/17/16   Emi HolesLaw, Alexandra M, PA-C  ?levofloxacin (LEVAQUIN) 250 MG tablet Take 3 tablets (750 mg total) by mouth daily. 05/14/15   Antony MaduraHumes, Kelly, PA-C  ?levothyroxine (SYNTHROID, LEVOTHROID) 75 MCG tablet 1 po at hs ?Patient taking differently: Take 75 mcg by mouth every morning.  01/06/15   Ivery QualeBryant, Hobson, PA-C  ?lisdexamfetamine (VYVANSE) 70 MG capsule Take 70 mg by mouth every morning.     [provider]  ?naproxen (NAPROSYN) 500 MG tablet Take 1 tablet (500 mg total) by mouth 2 (two) times daily. 05/14/15   Antony MaduraHumes, Kelly, PA-C  ?predniSONE (STERAPRED UNI-PAK 21 TAB) 5 MG (21) TBPK tablet Take 1 tablet (5 mg total) by mouth daily. 04/28/16   Alene Miresmohundro, Jennifer C, NP  ?sodium chloride (OCEAN) 0.65 % SOLN nasal spray Place 1 spray into both nostrils as needed for congestion. 10/07/14 12/15/14  Hess, Nada Boozerobyn M, PA-C  ?   ? ?Allergies    ?Patient has no known allergies.   ? ?Review of Systems   ?Review of Systems  ?Constitutional:  Negative for fever.  ?HENT:  Positive for congestion. Negative for sore throat.   ?Eyes:  Negative for redness.  ?Respiratory:  Positive for cough. Negative for shortness of breath.   ?Cardiovascular:  Negative for chest  pain.  ?Gastrointestinal:  Negative for abdominal pain and vomiting.  ?Genitourinary:  Negative for flank pain.  ?Musculoskeletal:  Negative for neck pain and neck stiffness.  ?Skin:  Negative for rash.  ?Neurological:  Negative for headaches.  ?Hematological:  Does not bruise/bleed easily.  ?Psychiatric/Behavioral:  Negative for confusion.   ? ?Physical Exam ?Updated Vital Signs ?BP 120/89   Pulse (!) 105   Temp 97.9 ?F (36.6 ?C) (Oral)   Resp 18   Ht 1.626 m (5\' 4" )   Wt 62.6 kg   LMP 11/13/2021 (Approximate)   SpO2 100%   BMI 23.69 kg/m?  ?Physical Exam ?Vitals and  nursing note reviewed.  ?Constitutional:   ?   Appearance: Normal appearance. She is well-developed.  ?HENT:  ?   Head: Atraumatic.  ?   Nose: Nose normal.  ?   Mouth/Throat:  ?   Mouth: Mucous membranes are moist.  ?Eyes:  ?   General: No scleral icterus. ?   Conjunctiva/sclera: Conjunctivae normal.  ?Neck:  ?   Trachea: No tracheal deviation.  ?   Comments: No stiffness or rigidity. ?Cardiovascular:  ?   Rate and Rhythm: Normal rate and regular rhythm.  ?   Pulses: Normal pulses.  ?   Heart sounds: Normal heart sounds. No murmur heard. ?  No friction rub. No gallop.  ?Pulmonary:  ?   Effort: Pulmonary effort is normal. No respiratory distress.  ?   Comments: Non prod cough, upper resp congestion.  ?Abdominal:  ?   General: There is no distension.  ?   Tenderness: There is no abdominal tenderness.  ?Genitourinary: ?   Comments: No cva tenderness.  ?Musculoskeletal:     ?   General: No swelling or tenderness.  ?   Cervical back: Normal range of motion and neck supple. No rigidity. No muscular tenderness.  ?   Right lower leg: No edema.  ?   Left lower leg: No edema.  ?Skin: ?   General: Skin is warm and dry.  ?   Findings: No rash.  ?Neurological:  ?   Mental Status: She is alert.  ?   Comments: Alert, speech normal.   ?Psychiatric:     ?   Mood and Affect: Mood normal.  ? ? ?ED Results / Procedures / Treatments   ?Labs ?(all labs ordered are listed, but only abnormal results are displayed) ?Labs Reviewed - No data to display ? ?EKG ?None ? ?Radiology ?DG Chest 2 View ? ?Result Date: 12/03/2021 ?CLINICAL DATA:  Cough with chest congestion, generalized chest pain, and shortness of breath for 3 days. EXAM: CHEST - 2 VIEW COMPARISON:  07/17/2016 FINDINGS: The heart size and mediastinal contours are within normal limits. Both lungs are clear. The visualized skeletal structures are unremarkable. IMPRESSION: No active cardiopulmonary disease. Electronically Signed   By: 07/19/2016 M.D.   On: 12/03/2021 21:27    ? ?Procedures ?Procedures  ? ? ?Medications Ordered in ED ?Medications  ?azithromycin (ZITHROMAX) tablet 500 mg (has no administration in time range)  ? ? ?ED Course/ Medical Decision Making/ A&P ?  ?                        ?Medical Decision Making ?Problems Addressed: ?Acute cough: acute illness or injury ?Bronchitis: acute illness or injury with systemic symptoms ? ?Amount and/or Complexity of Data Reviewed ?External Data Reviewed: notes. ?Radiology: ordered and independent interpretation performed. Decision-making details documented in ED Course. ? ?Risk ?  Prescription drug management. ? ? ?CXR.  ? ?Reviewed nursing notes and prior charts for additional history. External reports reviewed.  ? ?CXR reviewed/interpreted by me - no pna.  ? ?Confirmed nkda.  ? ?Zithromax po. ? ?Pt appears stable for d/c. ? ?Rx for home.  ? ?Return precautions provided.  ? ? ? ? ? ? ? ? ? ?Final Clinical Impression(s) / ED Diagnoses ?Final diagnoses:  ?Acute cough  ?Bronchitis  ? ? ?Rx / DC Orders ?ED Discharge Orders   ? ?      Ordered  ?  azithromycin (ZITHROMAX Z-PAK) 250 MG tablet  Daily       ? 12/03/21 2200  ? ?  ?  ? ?  ? ? ?  ?Cathren Laine, MD ?12/03/21 2203 ? ?

## 2021-12-03 NOTE — Discharge Instructions (Addendum)
It was our pleasure to provide your ER care today - we hope that you feel better. ? ?Take antibiotic as prescribed. Avoid smoking. Take nyquil, mucinex, or similar cough/cold medication as need for symptom relief.  ? ?Follow up with primary care doctor in 1-2 weeks if symptoms fail to improve/resolve. ? ?Return to ER if worse, new symptoms, chest pain, increased trouble breathing, or other concern.  ?

## 2021-12-03 NOTE — ED Notes (Signed)
Pt returned from X Ray.

## 2021-12-03 NOTE — ED Notes (Signed)
Patient transported to X-ray 

## 2022-07-03 ENCOUNTER — Emergency Department (HOSPITAL_BASED_OUTPATIENT_CLINIC_OR_DEPARTMENT_OTHER)
Admission: EM | Admit: 2022-07-03 | Discharge: 2022-07-03 | Disposition: A | Discharge status: Home / Self Care | Payer: Medicaid Other | Attending: Emergency Medicine | Admitting: Emergency Medicine

## 2022-07-03 ENCOUNTER — Emergency Department (HOSPITAL_BASED_OUTPATIENT_CLINIC_OR_DEPARTMENT_OTHER): Payer: Medicaid Other

## 2022-07-03 ENCOUNTER — Encounter (HOSPITAL_BASED_OUTPATIENT_CLINIC_OR_DEPARTMENT_OTHER): Payer: Self-pay | Admitting: Emergency Medicine

## 2022-07-03 ENCOUNTER — Other Ambulatory Visit: Payer: Self-pay

## 2022-07-03 DIAGNOSIS — R059 Cough, unspecified: Secondary | ICD-10-CM | POA: Diagnosis not present

## 2022-07-03 DIAGNOSIS — M79661 Pain in right lower leg: Secondary | ICD-10-CM | POA: Insufficient documentation

## 2022-07-03 DIAGNOSIS — R0789 Other chest pain: Secondary | ICD-10-CM | POA: Diagnosis not present

## 2022-07-03 LAB — CBC
HCT: 38 % (ref 36.0–46.0)
Hemoglobin: 13 g/dL (ref 12.0–15.0)
MCH: 33 pg (ref 26.0–34.0)
MCHC: 34.2 g/dL (ref 30.0–36.0)
MCV: 96.4 fL (ref 80.0–100.0)
Platelets: 283 10*3/uL (ref 150–400)
RBC: 3.94 MIL/uL (ref 3.87–5.11)
RDW: 12.1 % (ref 11.5–15.5)
WBC: 13.6 10*3/uL — ABNORMAL HIGH (ref 4.0–10.5)
nRBC: 0 % (ref 0.0–0.2)

## 2022-07-03 LAB — BASIC METABOLIC PANEL
Anion gap: 6 (ref 5–15)
BUN: 13 mg/dL (ref 6–20)
CO2: 23 mmol/L (ref 22–32)
Calcium: 8.9 mg/dL (ref 8.9–10.3)
Chloride: 107 mmol/L (ref 98–111)
Creatinine, Ser: 0.75 mg/dL (ref 0.44–1.00)
GFR, Estimated: >60 mL/min (ref >60)
Glucose, Bld: 115 mg/dL — ABNORMAL HIGH (ref 70–99)
Potassium: 3.6 mmol/L (ref 3.5–5.1)
Sodium: 136 mmol/L (ref 135–145)

## 2022-07-03 LAB — D-DIMER, QUANTITATIVE: D-Dimer, Quant: 0.29 ug/mL-FEU (ref 0.00–0.50)

## 2022-07-03 MED ORDER — ACETAMINOPHEN-CODEINE 300-30 MG PO TABS: 1.0000 | ORAL_TABLET | Freq: Three times a day (TID) | ORAL | 0 refills | Status: DC | PRN

## 2022-07-03 NOTE — ED Triage Notes (Signed)
Cough and shortness of breath x 3 months , also reports right cal pain and edema . Saw PCP , sent here for PE rule out .

## 2022-07-03 NOTE — ED Provider Notes (Signed)
[facility] EMERGENCY DEPARTMENT Provider Note   CSN: [id number] Arrival date & time: 07/03/22  2025     History  Chief Complaint  Patient presents with   Cough    Sarah Morrow is a 46 y.o. female presenting to the ED with ongoing cough for about 3 months, and also right calf pain for 2 months.  Patient says she was told to come in to "rule out blood clots" by her primary care provider.  She says she has had cramping in her right calf for 2 months but has not thought much about it.  She has also had a dry cough ongoing for several months which is driving her crazy, difficult to control.  She started taking her home antibiotics and prednisone last night to see if it would relieve her cough but it has not.  She does have sharp chest pain, in the center of her chest, worse with coughing.  She denies any personal history of DVT or PE but reports a family history of DVT in her father.  She denies estrogen use or recent surgery or prolonged immobilization.  HPI     Home Medications Prior to Admission medications   Medication Sig Start Date End Date Taking? Authorizing Provider  acetaminophen-codeine (TYLENOL #3) 300-30 MG tablet Take 1 tablet by mouth every 8 (eight) hours as needed for up to 15 doses for moderate pain. 07/03/22  Yes Jadzia Ibsen, Kermit Balo, MD  albuterol (PROVENTIL HFA;VENTOLIN HFA) 108 (90 Base) MCG/ACT inhaler Inhale 1-2 puffs into the lungs every 6 (six) hours as needed for wheezing or shortness of breath. 07/17/16   Law, Waylan Boga, PA-C  benzonatate (TESSALON) 100 MG capsule Take 1 capsule (100 mg total) by mouth every 8 (eight) hours. 07/17/16   Law, Waylan Boga, PA-C  cetirizine-pseudoephedrine (ZYRTEC-D) 5-120 MG tablet Take 1 tablet by mouth 2 (two) times daily. 07/17/16   Law, Waylan Boga, PA-C  clindamycin (CLEOCIN) 150 MG capsule Take 1 capsule (150 mg total) by mouth every 6 (six) hours. 11/05/20   Gwyneth Sprout, MD  clonazePAM (KLONOPIN) 1 MG tablet  Take 1 mg by mouth 4 (four) times daily as needed for anxiety.     [provider]  fluticasone (FLONASE) 50 MCG/ACT nasal spray Place 1 spray into both nostrils daily. 04/28/16   Alene Mires, NP  HYDROcodone-acetaminophen (NORCO/VICODIN) 5-325 MG tablet Take 1 tablet by mouth every 6 (six) hours as needed for severe pain. 11/05/20   Gwyneth Sprout, MD  ibuprofen (ADVIL,MOTRIN) 800 MG tablet Take 1 tablet (800 mg total) by mouth 3 (three) times daily. 07/17/16   Law, Waylan Boga, PA-C  levofloxacin (LEVAQUIN) 250 MG tablet Take 3 tablets (750 mg total) by mouth daily. 05/14/15   Antony Madura, PA-C  levothyroxine (SYNTHROID, LEVOTHROID) 75 MCG tablet 1 po at hs Patient taking differently: Take 75 mcg by mouth every morning.  01/06/15   Ivery Quale, PA-C  lisdexamfetamine (VYVANSE) 70 MG capsule Take 70 mg by mouth every morning.     [provider]  naproxen (NAPROSYN) 500 MG tablet Take 1 tablet (500 mg total) by mouth 2 (two) times daily. 05/14/15   Antony Madura, PA-C  predniSONE (STERAPRED UNI-PAK 21 TAB) 5 MG (21) TBPK tablet Take 1 tablet (5 mg total) by mouth daily. 04/28/16   Alene Mires, NP  sodium chloride (OCEAN) 0.65 % SOLN nasal spray Place 1 spray into both nostrils as needed for congestion. 10/07/14 12/15/14  Hess, Nada Boozer, PA-C  Allergies    Patient has no known allergies.    Review of Systems   Review of Systems  Physical Exam Updated Vital Signs BP 121/76 (BP Location: Right Arm)   Pulse 88   Temp 98.6 F (37 C) (Oral)   Resp 16   Wt 62.6 kg   LMP 05/08/2022   SpO2 97%   BMI 23.69 kg/m  Physical Exam Constitutional:      General: She is not in acute distress. HENT:     Head: Normocephalic and atraumatic.  Eyes:     Conjunctiva/sclera: Conjunctivae normal.     Pupils: Pupils are equal, round, and reactive to light.  Cardiovascular:     Rate and Rhythm: Normal rate and regular rhythm.  Pulmonary:     Effort: Pulmonary effort  is normal. No respiratory distress.  Abdominal:     General: There is no distension.     Tenderness: There is no abdominal tenderness.  Musculoskeletal:     Comments: Right calf posterior tenderness, without visible swelling  Skin:    General: Skin is warm and dry.  Neurological:     General: No focal deficit present.     Mental Status: She is alert. Mental status is at baseline.  Psychiatric:        Mood and Affect: Mood normal.        Behavior: Behavior normal.     ED Results / Procedures / Treatments   Labs (all labs ordered are listed, but only abnormal results are displayed) Labs Reviewed  BASIC METABOLIC PANEL - Abnormal; Notable for the following components:      Result Value   Glucose, Bld 115 (*)    All other components within normal limits  CBC - Abnormal; Notable for the following components:   WBC 13.6 (*)    All other components within normal limits  D-DIMER, QUANTITATIVE    EKG EKG Interpretation  Date/Time:  Tuesday July 03 2022 20:41:53 EDT Ventricular Rate:  96 PR Interval:  123 QRS Duration: 91 QT Interval:  347 QTC Calculation: 439 R Axis:   94 Text Interpretation: Sinus rhythm Right atrial enlargement Borderline right axis deviation Confirmed by Octaviano Glow [phone]) on 07/03/2022 8:49:17 PM  Radiology US Venous Img Lower Unilateral Right  Result Date: 07/03/2022 CLINICAL DATA:  Pain EXAM: RIGHT LOWER EXTREMITY VENOUS DOPPLER ULTRASOUND TECHNIQUE: Gray-scale sonography with compression, as well as color and duplex ultrasound, were performed to evaluate the deep venous system(s) from the level of the common femoral vein through the popliteal and proximal calf veins. COMPARISON:  None Available. FINDINGS: VENOUS Normal compressibility of the common femoral, superficial femoral, and popliteal veins, as well as the visualized calf veins. Visualized portions of profunda femoral vein and great saphenous vein unremarkable. No filling defects to suggest  DVT on grayscale or color Doppler imaging. Doppler waveforms show normal direction of venous flow, normal respiratory plasticity and response to augmentation. Limited views of the contralateral common femoral vein are unremarkable. OTHER None. Limitations: none IMPRESSION: Negative. Electronically Signed   By: Lucrezia Europe M.D.   On: 07/03/2022 21:52    Procedures Procedures    Medications Ordered in ED Medications - No data to display  ED Course/ Medical Decision Making/ A&P Clinical Course as of 07/03/22 2239  Tue Jul 03, 2022  2149 D-Dimer, Quant: 0.29 [MT]    Clinical Course User Index [MT] Wyvonnia Dusky, MD  Medical Decision Making Amount and/or Complexity of Data Reviewed Labs: ordered. Decision-making details documented in ED Course.  Risk Prescription drug management.   This patient presents to the ED with concern for right calf pain and tenderness, chronic cough, chest discomfort. This involves an extensive number of treatment options, and is a complaint that carries with it a high risk of complications and morbidity.  The differential diagnosis includes muscle cramping versus DVT versus PE versus other  I ordered and personally interpreted labs.  The pertinent results include: Was unremarkable, D-dimer negative  I ordered imaging studies including DVT ultrasound the right lower extremity I independently visualized and interpreted imaging which showed no acute DVT I agree with the radiologist interpretation  The patient was maintained on a cardiac monitor.  I personally viewed and interpreted the cardiac monitored which showed an underlying rhythm of: Normal sinus rhythm  Per my interpretation the patient's ECG shows normal sinus rhythm no acute ischemic findings   Test Considered: With a negative D-dimer I have a much lower clinical suspicion for PE do not feel that a CT angiogram is indicated at this time  After the interventions noted  above, I reevaluated the patient and found that they have: stayed the same  Dispostion:  After consideration of the diagnostic results and the patients response to treatment, I feel that the patent would benefit from outpatient follow-up, referral to pulmonology.  She reports 3 months of persistent cough that is interrupting her sleep and she is not getting any relief with over-the-counter medications.  I prescribed Tylenol 3 tablets to help her with sleep.  Placed a referral to pulmonology         Final Clinical Impression(s) / ED Diagnoses Final diagnoses:  Cough, unspecified type    Rx / DC Orders ED Discharge Orders          Ordered    Ambulatory referral to Pulmonology        07/03/22 2201    acetaminophen-codeine (TYLENOL #3) 300-30 MG tablet  Every 8 hours PRN        07/03/22 2201              Terald Sleeper, MD 07/03/22 2239

## 2022-07-19 ENCOUNTER — Encounter: Payer: Self-pay | Admitting: Pulmonary Disease

## 2022-07-19 ENCOUNTER — Ambulatory Visit (INDEPENDENT_AMBULATORY_CARE_PROVIDER_SITE_OTHER): Payer: Medicaid Other | Admitting: Pulmonary Disease

## 2022-07-19 VITALS — BP 118/72 | HR 109 | Temp 98.0°F | Ht 64.0 in | Wt 138.8 lb

## 2022-07-19 DIAGNOSIS — R053 Chronic cough: Secondary | ICD-10-CM

## 2022-07-19 DIAGNOSIS — R0609 Other forms of dyspnea: Secondary | ICD-10-CM

## 2022-07-19 MED ORDER — FLUTICASONE-SALMETEROL 230-21 MCG/ACT IN AERO: 2.0000 | INHALATION_SPRAY | Freq: Two times a day (BID) | RESPIRATORY_TRACT | 12 refills | Status: DC

## 2022-07-19 NOTE — Progress Notes (Signed)
@Patient  ID: , female    DOB: November 15, 1975, 46 y.o.   MRN: [id number]  Chief Complaint  Patient presents with   Hospitalization Follow-up    07/03/22 possible blood clot, chronic cough 3 months.     Referring provider: 07/05/22, MD  HPI:   46 y.o. woman whom we are seeing for evaluation of chronic cough, chest tightness, shortness of breath.  Recent ED note 06/2022 reviewed.  Patient has longstanding history of recurrent bronchitis.  Lasting for many weeks.  Usually improves with prednisone.  Has been prescribed albuterol in the past.  Does not really use it.  Recently, had prolonged cough.  3 months.  Dry.  Worse in the evenings.  No position make things better or worse.  No environmental or seasonal factors she can identify to make things better or worse.  No other alleviating or exacerbating factors.Went to the emergency room late 06/2022.  Had some leg pain.  D-dimer was negative.  Lower extremity Dopplers were negative for clot.  Has longstanding DOE. Chest tightness. Prednisone has helped in past.   PMH: Hypothyroid, ADHD Surgical history: Colonoscopy Family history:History reviewed. No pertinent family history. Social history: Current smoker, cutting back, lives in [place] / Pulmonary Flowsheets:   ACT:      No data to display          MMRC:     No data to display          Epworth:      No data to display          Tests:   FENO:  No results found for: "NITRICOXIDE"  PFT:     No data to display          WALK:      No data to display          Imaging: Personally reviewed and as per EMR and discussion this note [place] Venous Img Lower Unilateral Right  Result Date: 07/03/2022 CLINICAL DATA:  Pain EXAM: RIGHT LOWER EXTREMITY VENOUS DOPPLER ULTRASOUND TECHNIQUE: Gray-scale sonography with compression, as well as color and duplex ultrasound, were performed to evaluate the deep venous system(s) from the  level of the common femoral vein through the popliteal and proximal calf veins. COMPARISON:  None Available. FINDINGS: VENOUS Normal compressibility of the common femoral, superficial femoral, and popliteal veins, as well as the visualized calf veins. Visualized portions of profunda femoral vein and great saphenous vein unremarkable. No filling defects to suggest DVT on grayscale or color Doppler imaging. Doppler waveforms show normal direction of venous flow, normal respiratory plasticity and response to augmentation. Limited views of the contralateral common femoral vein are unremarkable. OTHER None. Limitations: none IMPRESSION: Negative. Electronically Signed   By: 07/05/2022 M.D.   On: 07/03/2022 21:52    Lab Results: Personally reviewed CBC    Component Value Date/Time   WBC 13.6 (H) 07/03/2022 2119   RBC 3.94 07/03/2022 2119   HGB 13.0 07/03/2022 2119   HCT 38.0 07/03/2022 2119   PLT 283 07/03/2022 2119   MCV 96.4 07/03/2022 2119   MCH 33.0 07/03/2022 2119   MCHC 34.2 07/03/2022 2119   RDW 12.1 07/03/2022 2119   LYMPHSABS 2.1 05/06/2015 1615   MONOABS 0.6 05/06/2015 1615   EOSABS 0.1 05/06/2015 1615   BASOSABS 0.0 05/06/2015 1615    BMET    Component Value Date/Time   NA 136 07/03/2022 2119   K 3.6 07/03/2022 2119  CL 107 07/03/2022 2119   CO2 23 07/03/2022 2119   GLUCOSE 115 (H) 07/03/2022 2119   BUN 13 07/03/2022 2119   CREATININE 0.75 07/03/2022 2119   CALCIUM 8.9 07/03/2022 2119   GFRNONAA >60 07/03/2022 2119   GFRAA >60 05/14/2015 1438    BNP No results found for: "BNP"  ProBNP No results found for: "PROBNP"  Specialty Problems   None   No Known Allergies   There is no immunization history on file for this patient.  Past Medical History:  Diagnosis Date   Anxiety    Bipolar 1 disorder (HCC)    Bronchitis    Fibromyalgia    IBS (irritable bowel syndrome)    Ovarian cyst    Thyroid disease     Tobacco History: Social History   Tobacco Use   Smoking Status Some Days   Types: Cigarettes  Smokeless Tobacco Never   Ready to quit: Not Answered Counseling given: Not Answered   Continue to not smoke  Outpatient Encounter Medications as of 07/19/2022  Medication Sig   acetaminophen-codeine (TYLENOL #3) 300-30 MG tablet Take 1 tablet by mouth every 8 (eight) hours as needed for up to 15 doses for moderate pain.   ADDERALL XR 30 MG 24 hr capsule Take 30 mg by mouth daily.   albuterol (PROVENTIL HFA;VENTOLIN HFA) 108 (90 Base) MCG/ACT inhaler Inhale 1-2 puffs into the lungs every 6 (six) hours as needed for wheezing or shortness of breath.   clonazePAM (KLONOPIN) 1 MG tablet Take 1 mg by mouth 4 (four) times daily as needed for anxiety.    fluticasone-salmeterol (ADVAIR HFA) 230-21 MCG/ACT inhaler Inhale 2 puffs into the lungs 2 (two) times daily.   ibuprofen (ADVIL,MOTRIN) 800 MG tablet Take 1 tablet (800 mg total) by mouth 3 (three) times daily.   levothyroxine (SYNTHROID, LEVOTHROID) 75 MCG tablet 1 po at hs (Patient not taking: Reported on 07/19/2022)   [DISCONTINUED] benzonatate (TESSALON) 100 MG capsule Take 1 capsule (100 mg total) by mouth every 8 (eight) hours. (Patient not taking: Reported on 07/19/2022)   [DISCONTINUED] cetirizine-pseudoephedrine (ZYRTEC-D) 5-120 MG tablet Take 1 tablet by mouth 2 (two) times daily. (Patient not taking: Reported on 07/19/2022)   [DISCONTINUED] clindamycin (CLEOCIN) 150 MG capsule Take 1 capsule (150 mg total) by mouth every 6 (six) hours.   [DISCONTINUED] fluticasone (FLONASE) 50 MCG/ACT nasal spray Place 1 spray into both nostrils daily. (Patient not taking: Reported on 07/19/2022)   [DISCONTINUED] HYDROcodone-acetaminophen (NORCO/VICODIN) 5-325 MG tablet Take 1 tablet by mouth every 6 (six) hours as needed for severe pain. (Patient not taking: Reported on 07/19/2022)   [DISCONTINUED] levofloxacin (LEVAQUIN) 250 MG tablet Take 3 tablets (750 mg total) by mouth daily. (Patient not taking:  Reported on 07/19/2022)   [DISCONTINUED] lisdexamfetamine (VYVANSE) 70 MG capsule Take 70 mg by mouth every morning.    [DISCONTINUED] naproxen (NAPROSYN) 500 MG tablet Take 1 tablet (500 mg total) by mouth 2 (two) times daily. (Patient not taking: Reported on 07/19/2022)   [DISCONTINUED] predniSONE (STERAPRED UNI-PAK 21 TAB) 5 MG (21) TBPK tablet Take 1 tablet (5 mg total) by mouth daily. (Patient not taking: Reported on 07/19/2022)   [DISCONTINUED] sodium chloride (OCEAN) 0.65 % SOLN nasal spray Place 1 spray into both nostrils as needed for congestion.   No facility-administered encounter medications on file as of 07/19/2022.     Review of Systems  Review of Systems  No chest pain with exertion.  No orthopnea or PND.  Comprehensive review of systems  otherwise negative. Physical Exam  BP 118/72 (BP Location: Left Arm, Cuff Size: Normal)   Pulse (!) 109   Temp 98 F (36.7 C)   Ht 5\' 4"  (1.626 m)   Wt 138 lb 12.8 oz (63 kg)   LMP 05/08/2022   SpO2 100%   BMI 23.82 kg/m   Wt Readings from Last 5 Encounters:  07/19/22 138 lb 12.8 oz (63 kg)  07/03/22 138 lb (62.6 kg)  12/03/21 138 lb (62.6 kg)  11/05/20 138 lb 6 oz (62.8 kg)  07/17/16 120 lb (54.4 kg)    BMI Readings from Last 5 Encounters:  07/19/22 23.82 kg/m  07/03/22 23.69 kg/m  12/03/21 23.69 kg/m  11/05/20 23.75 kg/m  07/17/16 20.60 kg/m     Physical Exam General: Sitting in chair, no acute distress Eyes: EOMI, no icterus Neck: Supple, no JVP Pulmonary: Clear, normal work of breathing Cardiovascular: Warm, no edema Abdomen: Nondistended, bowel sounds present MSK: No synovitis, no joint effusion Neuro: Normal gait, no weakness Psych: Normal mood, full affect  Assessment & Plan:   Chronic cough: Last 3 months, dry.  Recurrent bronchitis in the past.  High suspicion for asthma as primary driver.  Moderate persistent asthma: Recurrent bronchitis, chest tightness, shortness of breath.  Cough.  Start  high-dose ICS/LABA therapy via high-dose Advair HFA twice daily.  Continue albuterol as needed.  Consider phenotyping in the future, escalation to triple inhaled therapy if symptoms not well controlled.  DOE: Likely asthma.  Chest x-ray clear in the past, recently 12/2021.  Recent evaluation in the ED with negative D-dimer, normal lower extremity Dopplers.  Assess response to treatment as above   Return in about 3 months (around 10/19/2022).   10/21/2022, MD 07/19/2022

## 2022-07-19 NOTE — Patient Instructions (Addendum)
Nice to meet you  Use Advair 2 puff twice daily every day.  Rinse your mouth out thoroughly with water after every use.  Please let me know if this is too expensive and we will look for a more cost effective option.  This is to treat cough and some of the shortness of breath you are having.  Given the recurrent or prolonged bronchitis I am worried most about the asthma.  Continue use albuterol as needed, 2 puffs every 4-6 hours as needed.  Please let me know if things or not improving  Return to clinic in 3 months or sooner as needed with Dr. Judeth Horn

## 2022-07-23 ENCOUNTER — Other Ambulatory Visit: Payer: Self-pay | Admitting: Pulmonary Disease

## 2022-07-23 ENCOUNTER — Telehealth: Payer: Self-pay | Admitting: Pulmonary Disease

## 2022-07-23 MED ORDER — FLUTICASONE-SALMETEROL 230-21 MCG/ACT IN AERO: 2.0000 | INHALATION_SPRAY | Freq: Two times a day (BID) | RESPIRATORY_TRACT | 12 refills | Status: DC

## 2022-07-23 NOTE — Telephone Encounter (Signed)
Called and spoke to patient and verified medication that she needed sent in and it was Advair. And she verified pharmacy with me. Medication sent in. Nothing further needed

## 2023-03-11 ENCOUNTER — Other Ambulatory Visit: Payer: Self-pay

## 2023-03-11 ENCOUNTER — Emergency Department (HOSPITAL_BASED_OUTPATIENT_CLINIC_OR_DEPARTMENT_OTHER): Payer: Self-pay

## 2023-03-11 ENCOUNTER — Encounter (HOSPITAL_BASED_OUTPATIENT_CLINIC_OR_DEPARTMENT_OTHER): Payer: Self-pay

## 2023-03-11 ENCOUNTER — Emergency Department (HOSPITAL_BASED_OUTPATIENT_CLINIC_OR_DEPARTMENT_OTHER)
Admission: EM | Admit: 2023-03-11 | Discharge: 2023-03-12 | Disposition: A | Discharge status: Home / Self Care | Payer: Self-pay | Attending: Emergency Medicine | Admitting: Emergency Medicine

## 2023-03-11 DIAGNOSIS — K5792 Diverticulitis of intestine, part unspecified, without perforation or abscess without bleeding: Secondary | ICD-10-CM

## 2023-03-11 DIAGNOSIS — K5732 Diverticulitis of large intestine without perforation or abscess without bleeding: Secondary | ICD-10-CM | POA: Insufficient documentation

## 2023-03-11 DIAGNOSIS — Z87891 Personal history of nicotine dependence: Secondary | ICD-10-CM | POA: Insufficient documentation

## 2023-03-11 LAB — HCG, QUANTITATIVE, PREGNANCY: hCG, Beta Chain, Quant, S: <1 m[IU]/mL (ref <5)

## 2023-03-11 LAB — URINALYSIS, ROUTINE W REFLEX MICROSCOPIC
Glucose, UA: NEGATIVE mg/dL
Ketones, ur: NEGATIVE mg/dL
Leukocytes,Ua: NEGATIVE
Nitrite: POSITIVE — AB
Protein, ur: >=300 mg/dL — AB
Specific Gravity, Urine: >=1.03 (ref 1.005–1.030)
pH: 5.5 (ref 5.0–8.0)

## 2023-03-11 LAB — URINALYSIS, MICROSCOPIC (REFLEX): RBC / HPF: >50 RBC/hpf (ref 0–5)

## 2023-03-11 LAB — COMPREHENSIVE METABOLIC PANEL
ALT: 16 U/L (ref 0–44)
AST: 20 U/L (ref 15–41)
Albumin: 4 g/dL (ref 3.5–5.0)
Alkaline Phosphatase: 46 U/L (ref 38–126)
Anion gap: 8 (ref 5–15)
BUN: 15 mg/dL (ref 6–20)
CO2: 23 mmol/L (ref 22–32)
Calcium: 9 mg/dL (ref 8.9–10.3)
Chloride: 105 mmol/L (ref 98–111)
Creatinine, Ser: 0.71 mg/dL (ref 0.44–1.00)
GFR, Estimated: >60 mL/min (ref >60)
Glucose, Bld: 95 mg/dL (ref 70–99)
Potassium: 3.8 mmol/L (ref 3.5–5.1)
Sodium: 136 mmol/L (ref 135–145)
Total Bilirubin: 0.6 mg/dL (ref 0.3–1.2)
Total Protein: 7.1 g/dL (ref 6.5–8.1)

## 2023-03-11 LAB — PREGNANCY, URINE: Preg Test, Ur: NEGATIVE

## 2023-03-11 LAB — CBC WITH DIFFERENTIAL/PLATELET
Abs Immature Granulocytes: 0.02 10*3/uL (ref 0.00–0.07)
Basophils Absolute: 0 10*3/uL (ref 0.0–0.1)
Basophils Relative: 0 %
Eosinophils Absolute: 0.2 10*3/uL (ref 0.0–0.5)
Eosinophils Relative: 2 %
HCT: 38.2 % (ref 36.0–46.0)
Hemoglobin: 13 g/dL (ref 12.0–15.0)
Immature Granulocytes: 0 %
Lymphocytes Relative: 21 %
Lymphs Abs: 2.4 10*3/uL (ref 0.7–4.0)
MCH: 32.7 pg (ref 26.0–34.0)
MCHC: 34 g/dL (ref 30.0–36.0)
MCV: 96.2 fL (ref 80.0–100.0)
Monocytes Absolute: 0.7 10*3/uL (ref 0.1–1.0)
Monocytes Relative: 6 %
Neutro Abs: 8.1 10*3/uL — ABNORMAL HIGH (ref 1.7–7.7)
Neutrophils Relative %: 71 %
Platelets: 251 10*3/uL (ref 150–400)
RBC: 3.97 MIL/uL (ref 3.87–5.11)
RDW: 12.1 % (ref 11.5–15.5)
WBC: 11.4 10*3/uL — ABNORMAL HIGH (ref 4.0–10.5)
nRBC: 0 % (ref 0.0–0.2)

## 2023-03-11 LAB — LIPASE, BLOOD: Lipase: 39 U/L (ref 11–51)

## 2023-03-11 MED ORDER — NAPROXEN 500 MG PO TABS: 500.0000 mg | ORAL_TABLET | Freq: Two times a day (BID) | ORAL | 0 refills | Status: DC

## 2023-03-11 MED ORDER — HYDROCODONE-ACETAMINOPHEN 5-325 MG PO TABS: 1.0000 | ORAL_TABLET | ORAL | 0 refills | Status: DC | PRN

## 2023-03-11 MED ORDER — METRONIDAZOLE 500 MG PO TABS
500.0000 mg | ORAL_TABLET | Freq: Once | ORAL | Status: AC
  Administered 2023-03-11: 500 mg via ORAL
  Filled 2023-03-11: qty 1

## 2023-03-11 MED ORDER — METRONIDAZOLE 500 MG PO TABS: 500.0000 mg | ORAL_TABLET | Freq: Three times a day (TID) | ORAL | 0 refills | Status: AC

## 2023-03-11 MED ORDER — HYDROCODONE-ACETAMINOPHEN 5-325 MG PO TABS
1.0000 | ORAL_TABLET | Freq: Once | ORAL | Status: AC
  Administered 2023-03-11: 1 via ORAL
  Filled 2023-03-11: qty 1

## 2023-03-11 MED ORDER — IOHEXOL 300 MG/ML  SOLN
100.0000 mL | Freq: Once | INTRAMUSCULAR | Status: AC | PRN
  Administered 2023-03-11: 100 mL via INTRAVENOUS

## 2023-03-11 MED ORDER — CIPROFLOXACIN HCL 500 MG PO TABS
500.0000 mg | ORAL_TABLET | Freq: Once | ORAL | Status: AC
  Administered 2023-03-11: 500 mg via ORAL
  Filled 2023-03-11: qty 1

## 2023-03-11 MED ORDER — IBUPROFEN 800 MG PO TABS
800.0000 mg | ORAL_TABLET | Freq: Once | ORAL | Status: AC | PRN
  Administered 2023-03-11: 800 mg via ORAL
  Filled 2023-03-11: qty 1

## 2023-03-11 MED ORDER — CIPROFLOXACIN HCL 500 MG PO TABS: 500.0000 mg | ORAL_TABLET | Freq: Two times a day (BID) | ORAL | 0 refills | Status: AC

## 2023-03-11 NOTE — ED Provider Triage Note (Signed)
Emergency Medicine Provider Triage Evaluation Note  Sarah Morrow , a 47 y.o. female  was evaluated in triage.  Pt complains of abdominal pain. Started all of a sudden this morning. Located in the lower abdomen. Feel sharp and burning and radiates to groin at times. Reports 1.5 weeks late on period and is usually regular. Endorses vaginal spotting. Also endorses nausea and diarrhea but no fever.   Review of Systems  Positive: See above Negative: See above  Physical Exam  BP (!) 137/101 (BP Location: Right Arm)   Pulse 74   Temp 98.3 F (36.8 C) (Oral)   Resp 18   Ht 5\' 4"  (1.626 m)   Wt 68 kg   SpO2 100%   BMI 25.75 kg/m  Gen:   Awake, no distress   Resp:  Normal effort  MSK:   Moves extremities without difficulty  Other:  Rlq tenderness  Medical Decision Making  Medically screening exam initiated at 5:29 PM.  Appropriate orders placed.  Sarah Morrow was informed that the remainder of the evaluation will be completed by another provider, this initial triage assessment does not replace that evaluation, and the importance of remaining in the ED until their evaluation is complete.  Work up started   Sarah Morrow, [place] 03/11/23 1731

## 2023-03-11 NOTE — ED Triage Notes (Signed)
Pt reports lower abd stabbing pain that started this morning. She reports she is 1.5 weeks late for her period. Denies vaginal bleeding (some spotting) and denies vaginal discharge. She reports she thought she had a UTI 3 days ago due to dysuria but has since went away. No urinary symptoms now.

## 2023-03-11 NOTE — Discharge Instructions (Signed)
You were evaluated in the Emergency Department and after careful evaluation, we did not find any emergent condition requiring admission or further testing in the hospital.  Your exam/testing today is overall reassuring.  Symptoms seem to be due to diverticulitis.  You also had some signs of a urinary tract infection.  Take the ciprofloxacin and Flagyl antibiotics as directed.  Use the Naprosyn twice daily for pain.  Use the Norco tablets for more significant pain.  Please return to the Emergency Department if you experience any worsening of your condition.   Thank you for allowing [place] to be a part of your care.

## 2023-03-11 NOTE — ED Provider Notes (Signed)
[facility]PT [facility] Emergency Department Provider Note MRN:  [id number]  Arrival date & time: 03/11/23     Chief Complaint   Abdominal Pain   History of Present Illness   Sarah Morrow is a 47 y.o. year-old female with a history of bipolar disorder, diverticulitis presenting to the ED with chief complaint of abdominal pain.  Left lower quadrant abdominal pain over the past 12 hours.  Denies fever.  Some blood from the vaginal area when wiping today.  Thought maybe she was pregnant, 1 to 2 weeks late.  Review of Systems  A thorough review of systems was obtained and all systems are negative except as noted in the HPI and PMH.   Patient's Health History    Past Medical History:  Diagnosis Date   Anxiety    Bipolar 1 disorder (HCC)    Bronchitis    Fibromyalgia    IBS (irritable bowel syndrome)    Ovarian cyst    Thyroid disease     Past Surgical History:  Procedure Laterality Date   COLONOSCOPY      History reviewed. No pertinent family history.  Social History   Socioeconomic History   Marital status: Single    Spouse name: Not on file   Number of children: Not on file   Years of education: Not on file   Highest education level: Not on file  Occupational History   Not on file  Tobacco Use   Smoking status: Some Days    Types: Cigarettes   Smokeless tobacco: Never  Substance and Sexual Activity   Alcohol use: No   Drug use: No   Sexual activity: Yes    Birth control/protection: None  Other Topics Concern   Not on file  Social History Narrative   Not on file   Social Determinants of Health   Financial Resource Strain: Not on file  Food Insecurity: Not on file  Transportation Needs: Not on file  Physical Activity: Not on file  Stress: Not on file  Social Connections: Not on file  Intimate Partner Violence: Not on file     Physical Exam   Vitals:   03/11/23 1726 03/11/23 2116  BP: (!) 137/101 (!) 123/93  Pulse: 74 83  Resp:  18 18  Temp: 98.3 F (36.8 C) 97.7 F (36.5 C)  SpO2: 100% 99%    CONSTITUTIONAL: Well-appearing, NAD NEURO/PSYCH:  Alert and oriented x 3, no focal deficits EYES:  eyes equal and reactive ENT/NECK:  no LAD, no JVD CARDIO: Regular rate, well-perfused, normal S1 and S2 PULM:  CTAB no wheezing or rhonchi GI/GU:  non-distended, focal left lower quadrant tenderness to palpation MSK/SPINE:  No gross deformities, no edema SKIN:  no rash, atraumatic   *Additional and/or pertinent findings included in MDM below  Diagnostic and Interventional Summary    EKG Interpretation Date/Time:    Ventricular Rate:    PR Interval:    QRS Duration:    QT Interval:    QTC Calculation:   R Axis:      Text Interpretation:         Labs Reviewed  CBC WITH DIFFERENTIAL/PLATELET - Abnormal; Notable for the following components:      Result Value   WBC 11.4 (*)    Neutro Abs 8.1 (*)    All other components within normal limits  URINALYSIS, ROUTINE W REFLEX MICROSCOPIC - Abnormal; Notable for the following components:   Hgb urine dipstick LARGE (*)    Bilirubin Urine  MODERATE (*)    Protein, ur >=300 (*)    Nitrite POSITIVE (*)    All other components within normal limits  URINALYSIS, MICROSCOPIC (REFLEX) - Abnormal; Notable for the following components:   Bacteria, UA RARE (*)    All other components within normal limits  COMPREHENSIVE METABOLIC PANEL  LIPASE, BLOOD  PREGNANCY, URINE  HCG, QUANTITATIVE, PREGNANCY    CT ABDOMEN PELVIS W CONTRAST  Final Result      Medications  HYDROcodone-acetaminophen (NORCO/VICODIN) 5-325 MG per tablet 1 tablet (1 tablet Oral Given 03/11/23 1758)  ibuprofen (ADVIL) tablet 800 mg (800 mg Oral Given 03/11/23 2115)  iohexol (OMNIPAQUE) 300 MG/ML solution 100 mL (100 mLs Intravenous Contrast Given 03/11/23 2129)  ciprofloxacin (CIPRO) tablet 500 mg (500 mg Oral Given 03/11/23 2340)  metroNIDAZOLE (FLAGYL) tablet 500 mg (500 mg Oral Given 03/11/23 2340)   HYDROcodone-acetaminophen (NORCO/VICODIN) 5-325 MG per tablet 1 tablet (1 tablet Oral Given 03/11/23 2340)     Procedures  /  Critical Care Procedures  ED Course and Medical Decision Making  Initial Impression and Ddx Differential diagnosis includes diverticulitis, colitis, UTI, ovarian cyst, ectopic pregnancy  Past medical/surgical history that increases complexity of ED encounter: Diverticulitis  Interpretation of Diagnostics I personally reviewed the laboratory assessment and my interpretation is as follows: No significant blood count or electrolyte disturbance.  Urinalysis with evidence to suggest infection  CT scan showing signs of early diverticulitis.  Patient Reassessment and Ultimate Disposition/Management     Patient is well-appearing, nontoxic, vital signs normal, no fever, appropriate for discharge with outpatient management.  Patient management required discussion with the following services or consulting groups:  None  Complexity of Problems Addressed Acute illness or injury that poses threat of life of bodily function  Additional Data Reviewed and Analyzed Further history obtained from: None  Additional Factors Impacting ED Encounter Risk Prescriptions  Elmer Sow. Pilar Plate, MD [facility] Emergency Medicine [facility] [email or web address]  Final Clinical Impressions(s) / ED Diagnoses     ICD-10-CM   1. Diverticulitis  K57.92       ED Discharge Orders          Ordered    naproxen (NAPROSYN) 500 MG tablet  2 times daily        03/11/23 2346    HYDROcodone-acetaminophen (NORCO/VICODIN) 5-325 MG tablet  Every 4 hours PRN        03/11/23 2346    ciprofloxacin (CIPRO) 500 MG tablet  Every 12 hours        03/11/23 2346    metroNIDAZOLE (FLAGYL) 500 MG tablet  3 times daily        03/11/23 2346             Discharge Instructions Discussed with and Provided to Patient:    Discharge Instructions      You were evaluated in the  Emergency Department and after careful evaluation, we did not find any emergent condition requiring admission or further testing in the hospital.  Your exam/testing today is overall reassuring.  Symptoms seem to be due to diverticulitis.  You also had some signs of a urinary tract infection.  Take the ciprofloxacin and Flagyl antibiotics as directed.  Use the Naprosyn twice daily for pain.  Use the Norco tablets for more significant pain.  Please return to the Emergency Department if you experience any worsening of your condition.   Thank you for allowing [place] to be a part of your care.      Dare Sanger,  Elmer Sow, MD 03/11/23 [phone]

## 2023-03-15 ENCOUNTER — Emergency Department (HOSPITAL_BASED_OUTPATIENT_CLINIC_OR_DEPARTMENT_OTHER): Payer: Self-pay

## 2023-03-15 ENCOUNTER — Other Ambulatory Visit: Payer: Self-pay

## 2023-03-15 ENCOUNTER — Emergency Department (HOSPITAL_BASED_OUTPATIENT_CLINIC_OR_DEPARTMENT_OTHER)
Admission: EM | Admit: 2023-03-15 | Discharge: 2023-03-15 | Disposition: A | Discharge status: Home / Self Care | Payer: Self-pay | Attending: Emergency Medicine | Admitting: Emergency Medicine

## 2023-03-15 ENCOUNTER — Encounter (HOSPITAL_BASED_OUTPATIENT_CLINIC_OR_DEPARTMENT_OTHER): Payer: Self-pay

## 2023-03-15 DIAGNOSIS — R1032 Left lower quadrant pain: Secondary | ICD-10-CM | POA: Insufficient documentation

## 2023-03-15 DIAGNOSIS — R109 Unspecified abdominal pain: Secondary | ICD-10-CM

## 2023-03-15 DIAGNOSIS — R197 Diarrhea, unspecified: Secondary | ICD-10-CM | POA: Insufficient documentation

## 2023-03-15 LAB — COMPREHENSIVE METABOLIC PANEL
ALT: 28 U/L (ref 0–44)
AST: 45 U/L — ABNORMAL HIGH (ref 15–41)
Albumin: 4.4 g/dL (ref 3.5–5.0)
Alkaline Phosphatase: 46 U/L (ref 38–126)
Anion gap: 9 (ref 5–15)
BUN: 10 mg/dL (ref 6–20)
CO2: 25 mmol/L (ref 22–32)
Calcium: 9.4 mg/dL (ref 8.9–10.3)
Chloride: 104 mmol/L (ref 98–111)
Creatinine, Ser: 0.86 mg/dL (ref 0.44–1.00)
GFR, Estimated: >60 mL/min (ref >60)
Glucose, Bld: 100 mg/dL — ABNORMAL HIGH (ref 70–99)
Potassium: 4.1 mmol/L (ref 3.5–5.1)
Sodium: 138 mmol/L (ref 135–145)
Total Bilirubin: 0.8 mg/dL (ref 0.3–1.2)
Total Protein: 7.5 g/dL (ref 6.5–8.1)

## 2023-03-15 LAB — URINALYSIS, ROUTINE W REFLEX MICROSCOPIC
Bilirubin Urine: NEGATIVE
Glucose, UA: NEGATIVE mg/dL
Ketones, ur: NEGATIVE mg/dL
Leukocytes,Ua: NEGATIVE
Nitrite: POSITIVE — AB
Protein, ur: 30 mg/dL — AB
Specific Gravity, Urine: >=1.03 (ref 1.005–1.030)
pH: 6.5 (ref 5.0–8.0)

## 2023-03-15 LAB — CBC
HCT: 39.9 % (ref 36.0–46.0)
Hemoglobin: 13.8 g/dL (ref 12.0–15.0)
MCH: 32.7 pg (ref 26.0–34.0)
MCHC: 34.6 g/dL (ref 30.0–36.0)
MCV: 94.5 fL (ref 80.0–100.0)
Platelets: 276 10*3/uL (ref 150–400)
RBC: 4.22 MIL/uL (ref 3.87–5.11)
RDW: 11.9 % (ref 11.5–15.5)
WBC: 7.4 10*3/uL (ref 4.0–10.5)
nRBC: 0 % (ref 0.0–0.2)

## 2023-03-15 LAB — LIPASE, BLOOD: Lipase: 34 U/L (ref 11–51)

## 2023-03-15 LAB — PREGNANCY, URINE: Preg Test, Ur: NEGATIVE

## 2023-03-15 LAB — URINALYSIS, MICROSCOPIC (REFLEX)

## 2023-03-15 MED ORDER — IOHEXOL 300 MG/ML  SOLN
100.0000 mL | Freq: Once | INTRAMUSCULAR | Status: AC | PRN
  Administered 2023-03-15: 100 mL via INTRAVENOUS

## 2023-03-15 MED ORDER — SODIUM CHLORIDE 0.9 % IV BOLUS
1000.0000 mL | Freq: Once | INTRAVENOUS | Status: AC
  Administered 2023-03-15: 1000 mL via INTRAVENOUS

## 2023-03-15 MED ORDER — KETOROLAC TROMETHAMINE 15 MG/ML IJ SOLN
15.0000 mg | Freq: Once | INTRAMUSCULAR | Status: AC
  Administered 2023-03-15: 15 mg via INTRAVENOUS
  Filled 2023-03-15: qty 1

## 2023-03-15 MED ORDER — HYDROCODONE-ACETAMINOPHEN 5-325 MG PO TABS: 1.0000 | ORAL_TABLET | Freq: Four times a day (QID) | ORAL | 0 refills | Status: DC | PRN

## 2023-03-15 NOTE — ED Triage Notes (Addendum)
Entered in error 

## 2023-03-15 NOTE — ED Provider Notes (Signed)
[place] [facility] Provider Note   CSN: [id number] Arrival date & time: 03/15/23  1301     History  Chief Complaint  Patient presents with   Diarrhea   Abdominal Pain    Sarah Morrow is a 47 y.o. female.  HPI 47 year old female with a history of bipolar disorder, IBS, fibromyalgia, anxiety, thyroid disease, and other comorbidities presents with abdominal pain.  She states she is having continued and worsening abdominal pain and diarrhea.  Symptoms originally started 4 days ago.  Had diarrhea and abdominal pain in the left lower abdomen.  Came here and was treated for diverticulitis with Ciprofloxacin, Flagyl, and hydrocodone.  Hydrocodone helps but then it wears off and the pain is back.  The diarrhea seemed to go away but then came back a couple days ago.  She had numerous stools last night.  Every time she has diarrhea she has been having blood in the stools but then later upon questioning she states she is actually been having blood in the stools for 7 years or so.  She had a previous colonoscopy and was told she had polyps but never actually went to the follow-up visit.  The diarrhea has come back though and the pain has continued so she comes here.  She denies any fever but has had chills.  Home Medications Prior to Admission medications   Medication Sig Start Date End Date Taking? Authorizing Provider  acetaminophen-codeine (TYLENOL #3) 300-30 MG tablet Take 1 tablet by mouth every 8 (eight) hours as needed for up to 15 doses for moderate pain. 07/03/22   Terald Sleeper, MD  ADDERALL XR 30 MG 24 hr capsule Take 30 mg by mouth daily. 06/28/22   [provider]  albuterol (PROVENTIL HFA;VENTOLIN HFA) 108 (90 Base) MCG/ACT inhaler Inhale 1-2 puffs into the lungs every 6 (six) hours as needed for wheezing or shortness of breath. 07/17/16   Law, Waylan Boga, PA-C  ciprofloxacin (CIPRO) 500 MG tablet Take 1 tablet (500 mg total) by mouth  every 12 (twelve) hours for 7 days. 03/11/23 03/18/23  Sabas Sous, MD  clonazePAM (KLONOPIN) 1 MG tablet Take 1 mg by mouth 4 (four) times daily as needed for anxiety.     [provider]  fluticasone-salmeterol (ADVAIR HFA) 230-21 MCG/ACT inhaler Inhale 2 puffs into the lungs 2 (two) times daily. 07/23/22   Hunsucker, Lesia Sago, MD  HYDROcodone-acetaminophen (NORCO/VICODIN) 5-325 MG tablet Take 1 tablet by mouth every 4 (four) hours as needed. 03/11/23   Sabas Sous, MD  ibuprofen (ADVIL,MOTRIN) 800 MG tablet Take 1 tablet (800 mg total) by mouth 3 (three) times daily. 07/17/16   Emi Holes, PA-C  levothyroxine (SYNTHROID, LEVOTHROID) 75 MCG tablet 1 po at hs Patient not taking: Reported on 07/19/2022 01/06/15   Ivery Quale, PA-C  metroNIDAZOLE (FLAGYL) 500 MG tablet Take 1 tablet (500 mg total) by mouth 3 (three) times daily for 7 days. 03/11/23 03/18/23  Sabas Sous, MD  naproxen (NAPROSYN) 500 MG tablet Take 1 tablet (500 mg total) by mouth 2 (two) times daily. 03/11/23   Sabas Sous, MD  sodium chloride (OCEAN) 0.65 % SOLN nasal spray Place 1 spray into both nostrils as needed for congestion. 10/07/14 12/15/14  Hess, Nada Boozer, PA-C      Allergies    Patient has no known allergies.    Review of Systems   Review of Systems  Constitutional:  Positive for chills. Negative for  fever.  Gastrointestinal:  Positive for abdominal pain, blood in stool, diarrhea and nausea. Negative for vomiting.  Genitourinary:  Negative for dysuria.  Musculoskeletal:  Negative for back pain.    Physical Exam Updated Vital Signs BP (!) 126/95   Pulse 81   Temp 98.5 F (36.9 C) (Oral)   Resp 17   Ht 5\' 4"  (1.626 m)   Wt 68 kg   LMP 02/02/2023 (Approximate)   SpO2 99%   BMI 25.75 kg/m  Physical Exam Vitals and nursing note reviewed.  Constitutional:      General: She is not in acute distress.    Appearance: She is well-developed. She is not ill-appearing or diaphoretic.  HENT:      Head: Normocephalic and atraumatic.  Cardiovascular:     Rate and Rhythm: Normal rate and regular rhythm.     Heart sounds: Normal heart sounds.  Pulmonary:     Effort: Pulmonary effort is normal.     Breath sounds: Normal breath sounds.  Abdominal:     Palpations: Abdomen is soft.     Tenderness: There is abdominal tenderness in the left lower quadrant.  Skin:    General: Skin is warm and dry.  Neurological:     Mental Status: She is alert.     ED Results / Procedures / Treatments   Labs (all labs ordered are listed, but only abnormal results are displayed) Labs Reviewed  COMPREHENSIVE METABOLIC PANEL - Abnormal; Notable for the following components:      Result Value   Glucose, Bld 100 (*)    AST 45 (*)    All other components within normal limits  URINALYSIS, ROUTINE W REFLEX MICROSCOPIC - Abnormal; Notable for the following components:   Color, Urine AMBER (*)    Hgb urine dipstick TRACE (*)    Protein, ur 30 (*)    Nitrite POSITIVE (*)    All other components within normal limits  URINALYSIS, MICROSCOPIC (REFLEX) - Abnormal; Notable for the following components:   Bacteria, UA FEW (*)    All other components within normal limits  LIPASE, BLOOD  CBC  PREGNANCY, URINE    EKG None  Radiology No results found.  Procedures Procedures    Medications Ordered in ED Medications  iohexol (OMNIPAQUE) 300 MG/ML solution 100 mL (has no administration in time range)  ketorolac (TORADOL) 15 MG/ML injection 15 mg (15 mg Intravenous Given 03/15/23 1415)  sodium chloride 0.9 % bolus 1,000 mL (1,000 mLs Intravenous New Bag/Given 03/15/23 1414)    ED Course/ Medical Decision Making/ A&P                             Medical Decision Making Amount and/or Complexity of Data Reviewed Labs: ordered.    Details: Nitrites in the urine but no leukocytes.  No urinary symptoms. WBC normal and hemoglobin stable and normal at 13.8. Radiology: ordered.  Risk Prescription drug  management.   Patient CT is currently pending.  Care transferred to Dr. Theresia Lo.  Otherwise given a dose of Toradol for pain as she is driving.        Final Clinical Impression(s) / ED Diagnoses Final diagnoses:  None    Rx / DC Orders ED Discharge Orders     None         Pricilla Loveless, MD 03/15/23 1511

## 2023-03-15 NOTE — ED Triage Notes (Signed)
Patient having abd pain, diarrhea and blood in her stool. She stated she was dx with diverticulitis 4 days ago. She stated the medications are not helping.

## 2023-03-15 NOTE — ED Provider Notes (Signed)
Patient signed out to me at 1500 by Dr. Gwenlyn Fudge pending CT.  In short this is a 47 year old female was recently diagnosed with diverticulitis that presented to the emergency department with worsening abdominal pain.  Patient has been on Cipro and Flagyl at home and has been taking hydrocodone for pain.  On patient's arrival here she is afebrile and hemodynamically stable.  She had labs performed that showed no leukocytosis, urine was nitrite positive.  She has received Toradol and fluids for symptomatic management here.  Clinical Course as of 03/15/23 1648  Fri Mar 15, 2023  1603 CT with improvement of prior diverticulitis. [VK]  1643 Upon reassessment, patient reports no significant change in her pain but abdomen remains soft and minimally tender.  She is stable for discharge home.  Will give a refill of her pain medications and was recommended GI follow-up.  She will have social work consult to assist with insurance. [VK]    Clinical Course User Index [VK] Rexford Maus, DO      Rexford Maus, [place] 03/15/23 [phone]

## 2023-03-15 NOTE — Discharge Instructions (Signed)
You were seen in the emergency department for your abdominal pain.  Your workup shows that your diverticulitis is improving and he has had no complications or infection.  You should continue to complete your antibiotics as prescribed.  You can take Tylenol and Motrin as needed for pain and both can be taken up to every 6 hours.  Noted to be some Vicodin as needed for breakthrough pain.  Make sure that you are are not exceeding 4000 mg of Tylenol in 24 hours and the narcotics can make you drowsy so do not take before driving, working or operating heavy machinery.  You can follow-up with your primary doctor as well as in the GI clinic for further management and evaluation of your symptoms.  You should return to the emergency department for having significantly worsening pain, fevers, repetitive vomiting or any other new or concerning symptoms.

## 2023-04-01 ENCOUNTER — Encounter: Payer: Self-pay | Admitting: Internal Medicine

## 2023-06-05 ENCOUNTER — Ambulatory Visit: Payer: Self-pay | Admitting: Internal Medicine

## 2023-06-19 ENCOUNTER — Other Ambulatory Visit: Payer: Self-pay

## 2023-06-19 ENCOUNTER — Emergency Department (HOSPITAL_BASED_OUTPATIENT_CLINIC_OR_DEPARTMENT_OTHER)
Admission: EM | Admit: 2023-06-19 | Discharge: 2023-06-19 | Discharge status: Against Medical Advice | Payer: MEDICAID | Attending: Emergency Medicine | Admitting: Emergency Medicine

## 2023-06-19 ENCOUNTER — Encounter (HOSPITAL_BASED_OUTPATIENT_CLINIC_OR_DEPARTMENT_OTHER): Payer: Self-pay | Admitting: Emergency Medicine

## 2023-06-19 DIAGNOSIS — Z5321 Procedure and treatment not carried out due to patient leaving prior to being seen by health care provider: Secondary | ICD-10-CM | POA: Insufficient documentation

## 2023-06-19 DIAGNOSIS — R2233 Localized swelling, mass and lump, upper limb, bilateral: Secondary | ICD-10-CM | POA: Insufficient documentation

## 2023-06-19 NOTE — ED Triage Notes (Signed)
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

## 2023-10-22 IMAGING — DX DG CHEST 2V
2 series · 2 of 2 positions shown · non-contrast
Comparison: 07/17/2016

CLINICAL DATA: Cough with chest congestion, generalized chest pain,
and shortness of breath for 3 days.

EXAM:
CHEST - 2 VIEW

[chest pa]
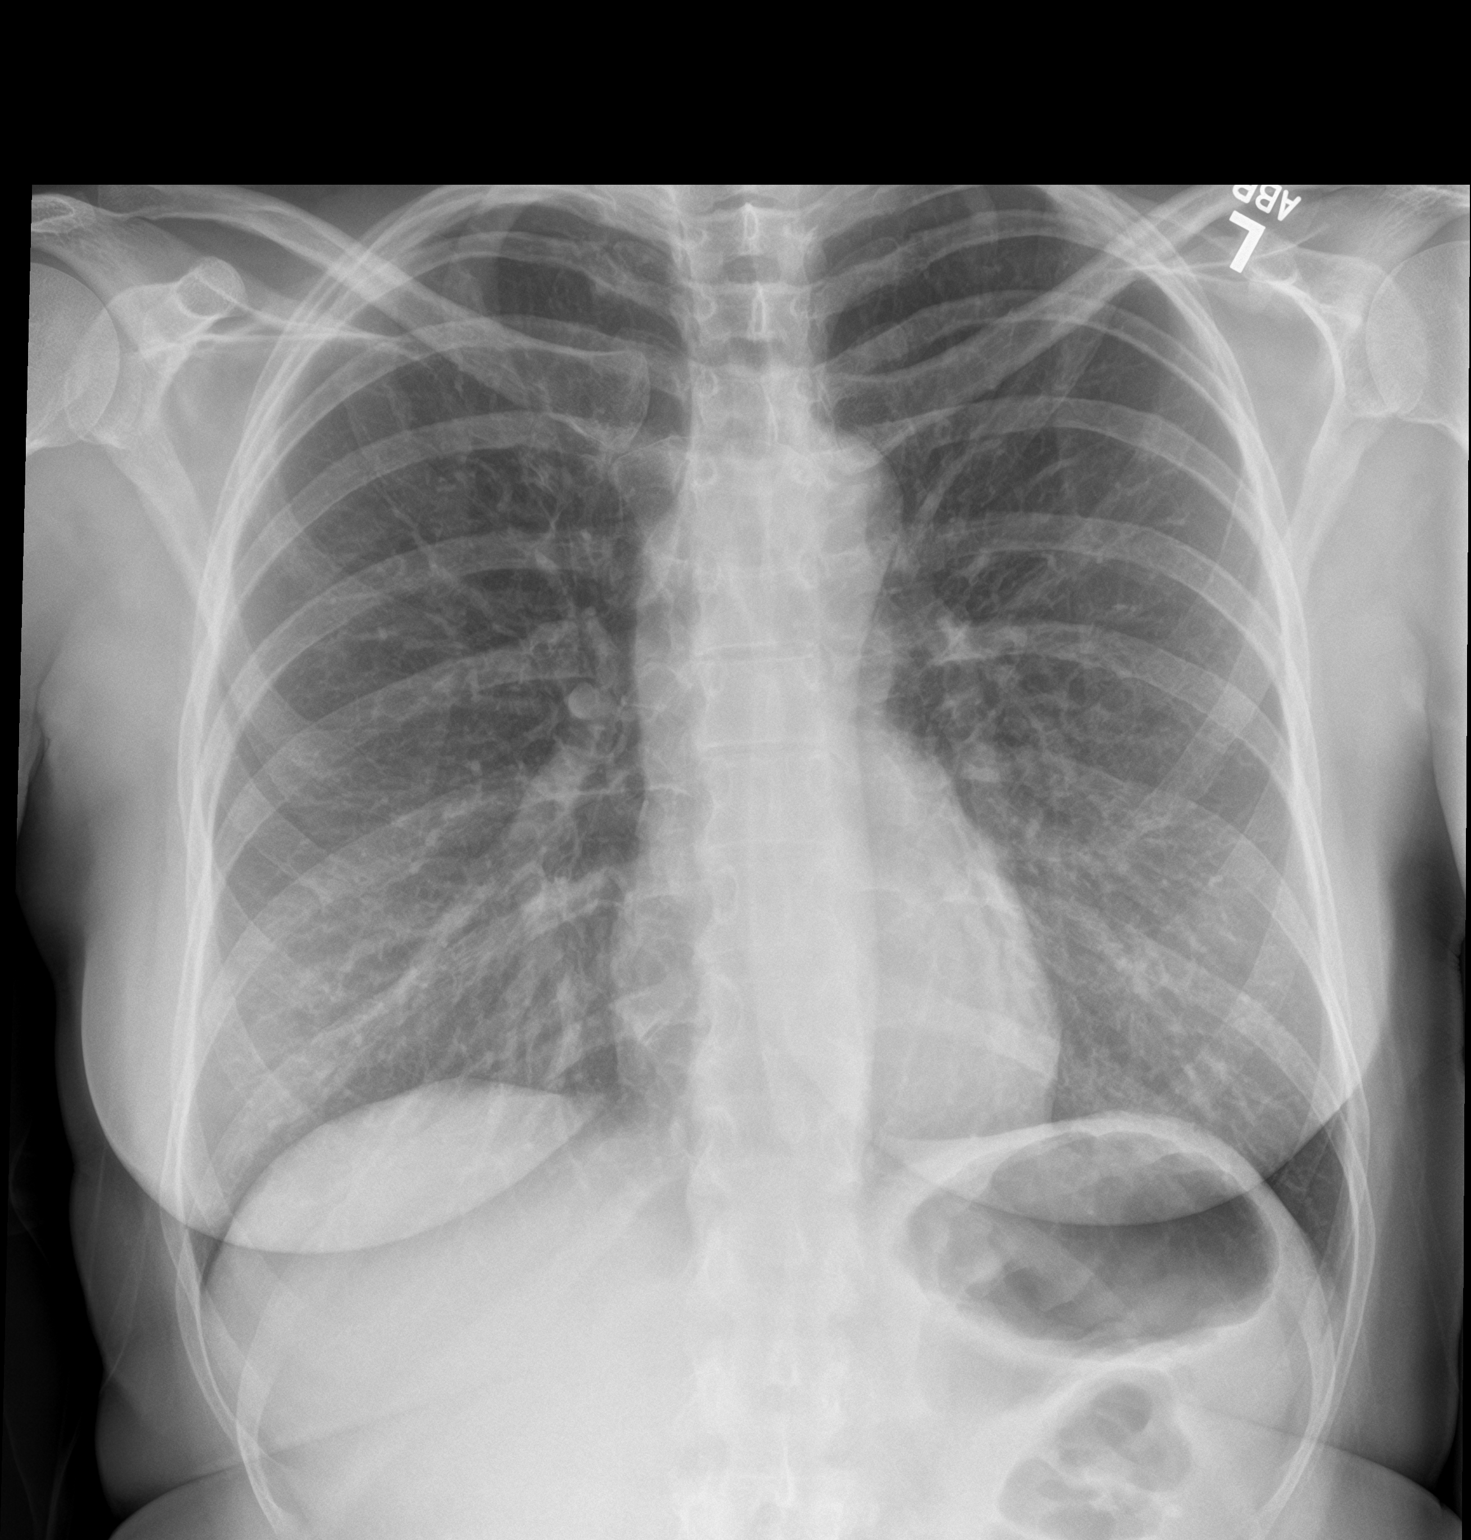

[chest lat]
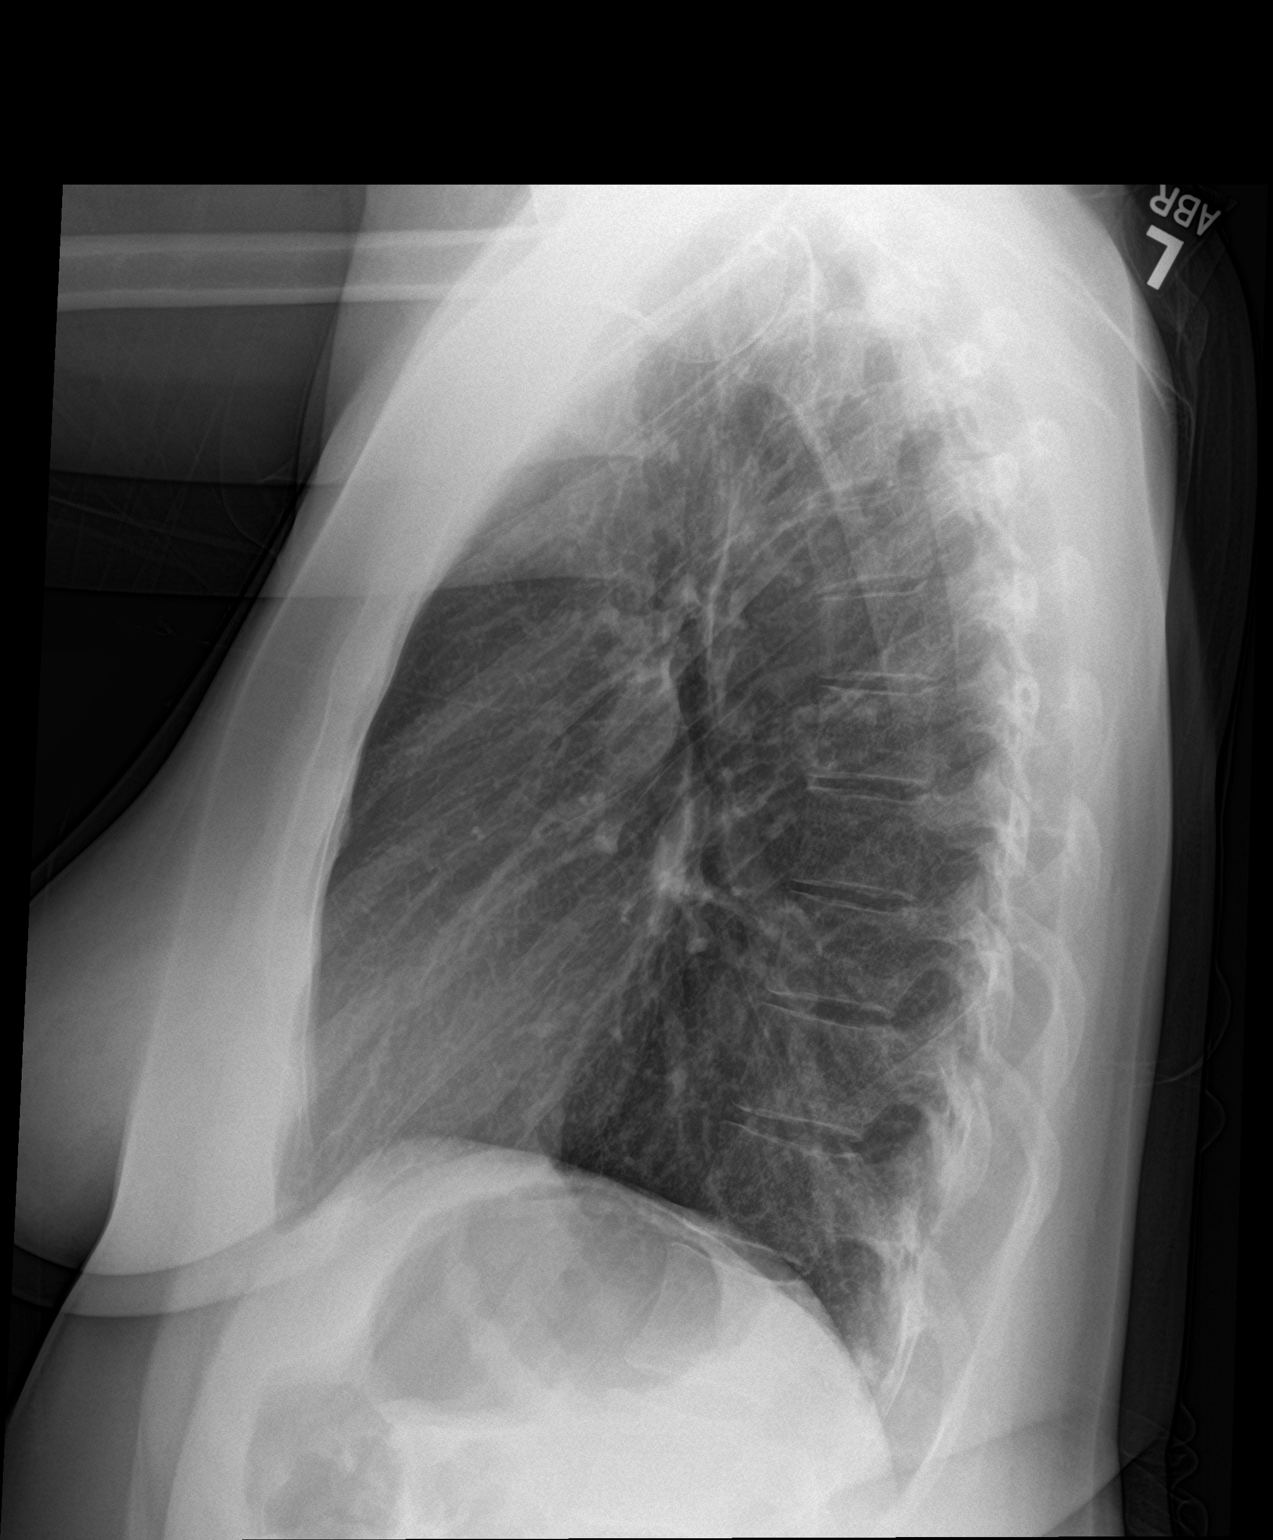

[2 of 2 positions shown; findings below may reference images not displayed]

FINDINGS: The heart size and mediastinal contours are within normal limits.
Both lungs are clear. The visualized skeletal structures are
unremarkable.
IMPRESSION: No active cardiopulmonary disease.

## 2023-12-19 ENCOUNTER — Other Ambulatory Visit: Payer: Self-pay

## 2023-12-19 ENCOUNTER — Ambulatory Visit
Admission: EM | Admit: 2023-12-19 | Discharge: 2023-12-19 | Disposition: A | Discharge status: Home / Self Care | Attending: Family Medicine | Admitting: Family Medicine

## 2023-12-19 DIAGNOSIS — F1121 Opioid dependence, in remission: Secondary | ICD-10-CM | POA: Diagnosis not present

## 2023-12-19 DIAGNOSIS — K047 Periapical abscess without sinus: Secondary | ICD-10-CM

## 2023-12-19 DIAGNOSIS — K0889 Other specified disorders of teeth and supporting structures: Secondary | ICD-10-CM

## 2023-12-19 MED ORDER — KETOROLAC TROMETHAMINE 30 MG/ML IJ SOLN
30.0000 mg | Freq: Once | INTRAMUSCULAR | Status: AC
  Administered 2023-12-19: 30 mg via INTRAMUSCULAR

## 2023-12-19 MED ORDER — AMOXICILLIN 875 MG PO TABS: 875.0000 mg | ORAL_TABLET | Freq: Two times a day (BID) | ORAL | 0 refills | Status: AC

## 2023-12-19 NOTE — ED Triage Notes (Signed)
 Has c/o dental pain x 1 week to right upper and lower. Has been using [employer]. No fever.

## 2023-12-19 NOTE — ED Provider Notes (Signed)
 [facility]    CSN: [id number] Arrival date & time: 12/19/23  1459      History   Chief Complaint Chief Complaint  Patient presents with   Dental Pain    HPI Sarah Morrow is a 48 y.o. female.   Patient is here for dental pain.  She has chronic TMJ.  She has multiple dental problems and states she thinks she currently has an abscess.  She has consulted her dentist and they states she needs to be on antibiotics before she can be seen.  She also would like help with pain management.    Past Medical History:  Diagnosis Date   Anxiety    Bipolar 1 disorder (HCC)    Bronchitis    Fibromyalgia    IBS (irritable bowel syndrome)    Ovarian cyst    Thyroid disease     There are no active problems to display for this patient.   Past Surgical History:  Procedure Laterality Date   COLONOSCOPY      OB History   No obstetric history on file.      Home Medications    Prior to Admission medications   Medication Sig Start Date End Date Taking? Authorizing Provider  amoxicillin (AMOXIL) 875 MG tablet Take 1 tablet (875 mg total) by mouth 2 (two) times daily. 12/19/23  Yes Stephany Ehrich, MD  buprenorphine-naloxone (SUBOXONE) 2-0.5 mg SUBL SL tablet Place under the tongue.   Yes [provider]  ADDERALL XR 30 MG 24 hr capsule Take 30 mg by mouth daily. 06/28/22   [provider]  clonazePAM (KLONOPIN) 1 MG tablet Take 1 mg by mouth 4 (four) times daily as needed for anxiety.     [provider]  sodium chloride (OCEAN) 0.65 % SOLN nasal spray Place 1 spray into both nostrils as needed for congestion. 10/07/14 12/15/14  Hess, [place], PA-C    Family History History reviewed. No pertinent family history.  Social History Social History   Tobacco Use   Smoking status: Some Days    Types: Cigarettes   Smokeless tobacco: Never  Substance Use Topics   Alcohol use: No   Drug use: No     Allergies   Patient has no known  allergies.   Review of Systems Review of Systems See HPI  Physical Exam Triage Vital Signs ED Triage Vitals  Encounter Vitals Group     BP 12/19/23 1507 119/83     Systolic BP Percentile --      Diastolic BP Percentile --      Pulse Rate 12/19/23 1507 (!) 112     Resp 12/19/23 1507 16     Temp 12/19/23 1507 98.7 F (37.1 C)     Temp src --      SpO2 12/19/23 1507 97 %     Weight --      Height --      Head Circumference --      Peak Flow --      Pain Score 12/19/23 1509 10     Pain Loc --      Pain Education --      Exclude from Growth Chart --    No data found.  Updated Vital Signs BP 119/83   Pulse (!) 112   Temp 98.7 F (37.1 C)   Resp 16   LMP 11/19/2023 (Approximate)   SpO2 97%      Physical Exam Constitutional:      General:  She is not in acute distress.    Appearance: She is well-developed.  HENT:     Head: Normocephalic and atraumatic.  Eyes:     Conjunctiva/sclera: Conjunctivae normal.     Pupils: Pupils are equal, round, and reactive to light.  Cardiovascular:     Rate and Rhythm: Normal rate.  Pulmonary:     Effort: Pulmonary effort is normal. No respiratory distress.  Musculoskeletal:        General: Normal range of motion.     Cervical back: Normal range of motion.  Skin:    General: Skin is warm and dry.  Neurological:     Mental Status: She is alert.      UC Treatments / Results  Labs (all labs ordered are listed, but only abnormal results are displayed) Labs Reviewed - No data to display  EKG   Radiology No results found.  Procedures Procedures (including critical care time)  Medications Ordered in UC Medications  ketorolac (TORADOL) 30 MG/ML injection 30 mg (30 mg Intramuscular Given 12/19/23 1529)    Initial Impression / Assessment and Plan / UC Course  I have reviewed the triage vital signs and the nursing notes.  Pertinent labs & imaging results that were available during my care of the patient were reviewed by  me and considered in my medical decision making (see chart for details).     I had discussed pain management with the patient and initially offered her hydrocodone.  After reviewed the [place]  database I decided this was ill-advised.  She is on daily Suboxone, clonazepam, and Adderall with as needed tramadol from a chronic pain clinic.  I therefore offered her a Toradol injection which she agreed to. Final Clinical Impressions(s) / UC Diagnoses   Final diagnoses:  Dental infection  Pain, dental  History of narcotic addiction [facility])     Discharge Instructions      Take the antibiotic 2 times a day See your dentist in follow-up     ED Prescriptions     Medication Sig Dispense Auth. Provider   amoxicillin (AMOXIL) 875 MG tablet Take 1 tablet (875 mg total) by mouth 2 (two) times daily. 14 tablet Stephany Ehrich, MD      PDMP not reviewed this encounter.   Stephany Ehrich, MD 12/19/23 [phone]

## 2023-12-19 NOTE — Discharge Instructions (Addendum)
 Take the antibiotic 2 times a day See your dentist in follow-up

## 2024-07-20 ENCOUNTER — Other Ambulatory Visit: Payer: Self-pay

## 2024-07-20 ENCOUNTER — Emergency Department (HOSPITAL_COMMUNITY)
Admission: EM | Admit: 2024-07-20 | Discharge: 2024-07-20 | Disposition: A | Discharge status: Home / Self Care | Attending: Emergency Medicine | Admitting: Emergency Medicine

## 2024-07-20 ENCOUNTER — Emergency Department (HOSPITAL_COMMUNITY)

## 2024-07-20 DIAGNOSIS — Z720 Tobacco use: Secondary | ICD-10-CM | POA: Insufficient documentation

## 2024-07-20 DIAGNOSIS — J209 Acute bronchitis, unspecified: Secondary | ICD-10-CM | POA: Diagnosis not present

## 2024-07-20 DIAGNOSIS — R059 Cough, unspecified: Secondary | ICD-10-CM | POA: Diagnosis present

## 2024-07-20 LAB — RESP PANEL BY RT-PCR (RSV, FLU A&B, COVID)  RVPGX2
Influenza A by PCR: NEGATIVE
Influenza B by PCR: NEGATIVE
Resp Syncytial Virus by PCR: NEGATIVE
SARS Coronavirus 2 by RT PCR: NEGATIVE

## 2024-07-20 MED ORDER — IBUPROFEN 600 MG PO TABS: 600.0000 mg | ORAL_TABLET | Freq: Four times a day (QID) | ORAL | 0 refills | Status: AC | PRN

## 2024-07-20 MED ORDER — AEROCHAMBER PLUS FLO-VU MEDIUM MISC: 1.0000 | Freq: Once | Status: DC

## 2024-07-20 MED ORDER — AMOXICILLIN-POT CLAVULANATE 875-125 MG PO TABS: 1.0000 | ORAL_TABLET | Freq: Two times a day (BID) | ORAL | 0 refills | Status: AC

## 2024-07-20 MED ORDER — KETOROLAC TROMETHAMINE 30 MG/ML IJ SOLN
30.0000 mg | Freq: Once | INTRAMUSCULAR | Status: AC
  Administered 2024-07-20: 30 mg via INTRAMUSCULAR
  Filled 2024-07-20: qty 1

## 2024-07-20 MED ORDER — DOXYCYCLINE HYCLATE 100 MG PO CAPS: 100.0000 mg | ORAL_CAPSULE | Freq: Two times a day (BID) | ORAL | 0 refills | Status: DC

## 2024-07-20 MED ORDER — DOXYCYCLINE HYCLATE 100 MG PO TABS
100.0000 mg | ORAL_TABLET | Freq: Once | ORAL | Status: AC
  Administered 2024-07-20: 100 mg via ORAL
  Filled 2024-07-20: qty 1

## 2024-07-20 MED ORDER — ALBUTEROL SULFATE HFA 108 (90 BASE) MCG/ACT IN AERS
1.0000 | INHALATION_SPRAY | RESPIRATORY_TRACT | Status: DC | PRN
  Administered 2024-07-20: 2 via RESPIRATORY_TRACT
  Filled 2024-07-20: qty 6.7

## 2024-07-20 MED ORDER — DEXAMETHASONE SOD PHOSPHATE PF 10 MG/ML IJ SOLN
10.0000 mg | Freq: Once | INTRAMUSCULAR | Status: AC
  Administered 2024-07-20: 10 mg via INTRAMUSCULAR

## 2024-07-20 MED ORDER — PREDNISONE 50 MG PO TABS: 50.0000 mg | ORAL_TABLET | Freq: Every day | ORAL | 0 refills | Status: AC

## 2024-07-20 NOTE — ED Provider Notes (Addendum)
 [place] EMERGENCY DEPARTMENT AT [facility] Provider Note   CSN: [id number] Arrival date & time: 07/20/24  1508     Patient presents with: Cough   Sarah Morrow is a 48 y.o. female.   Pt is a 48 yo female with pmhx significant for adhd, bipolar d/o, and fibromyalgia.  She has had cough and cold sx for the past 2 days.  She's had fever and chills as well.         Prior to Admission medications   Medication Sig Start Date End Date Taking? Authorizing Provider  doxycycline (VIBRAMYCIN) 100 MG capsule Take 1 capsule (100 mg total) by mouth 2 (two) times daily. 07/20/24  Yes Dean Clarity, MD  ibuprofen  (ADVIL ) 600 MG tablet Take 1 tablet (600 mg total) by mouth every 6 (six) hours as needed. 07/20/24  Yes Dean Clarity, MD  predniSONE  (DELTASONE ) 50 MG tablet Take 1 tablet (50 mg total) by mouth daily with breakfast. 07/20/24  Yes Dean Clarity, MD  ADDERALL XR 30 MG 24 hr capsule Take 30 mg by mouth daily. 06/28/22   [provider]  amoxicillin  (AMOXIL ) 875 MG tablet Take 1 tablet (875 mg total) by mouth 2 (two) times daily. 12/19/23   Maranda Jamee Jacob, MD  buprenorphine-naloxone (SUBOXONE) 2-0.5 mg SUBL SL tablet Place under the tongue.    [provider]  clonazePAM (KLONOPIN) 1 MG tablet Take 1 mg by mouth 4 (four) times daily as needed for anxiety.     [provider]  sodium chloride  (OCEAN) 0.65 % SOLN nasal spray Place 1 spray into both nostrils as needed for congestion. 10/07/14 12/15/14  Hess, Catheryn HERO, PA-C    Allergies: Patient has no known allergies.    Review of Systems  Constitutional:  Positive for chills and fever.  Respiratory:  Positive for cough and shortness of breath.   All other systems reviewed and are negative.   Updated Vital Signs BP (!) 106/90 (BP Location: Right Arm)   Pulse 91   Temp 98.6 F (37 C) (Oral)   Resp 18   Ht 5' 4 (1.626 m)   Wt 54.4 kg   LMP 07/17/2024 (Exact Date)   SpO2 99%   BMI  20.60 kg/m   Physical Exam Vitals and nursing note reviewed.  Constitutional:      Appearance: Normal appearance.  HENT:     Head: Normocephalic and atraumatic.     Right Ear: External ear normal.     Left Ear: External ear normal.     Nose: Nose normal.     Mouth/Throat:     Mouth: Mucous membranes are moist.     Pharynx: Oropharynx is clear.  Eyes:     Extraocular Movements: Extraocular movements intact.     Conjunctiva/sclera: Conjunctivae normal.     Pupils: Pupils are equal, round, and reactive to light.  Cardiovascular:     Rate and Rhythm: Normal rate and regular rhythm.     Pulses: Normal pulses.     Heart sounds: Normal heart sounds.  Pulmonary:     Effort: Pulmonary effort is normal.     Breath sounds: Wheezing present.  Abdominal:     General: Abdomen is flat. Bowel sounds are normal.     Palpations: Abdomen is soft.  Musculoskeletal:        General: Normal range of motion.     Cervical back: Normal range of motion and neck supple.  Skin:    General: Skin is warm.  Capillary Refill: Capillary refill takes less than 2 seconds.  Neurological:     General: No focal deficit present.     Mental Status: She is alert and oriented to person, place, and time.  Psychiatric:        Mood and Affect: Mood normal.        Behavior: Behavior normal.     (all labs ordered are listed, but only abnormal results are displayed) Labs Reviewed  RESP PANEL BY RT-PCR (RSV, FLU A&B, COVID)  RVPGX2    EKG: None  Radiology: DG Chest 2 View Result Date: 07/20/2024 CLINICAL DATA:  Cough, fever, chills and nausea for 2 days. EXAM: CHEST - 2 VIEW COMPARISON:  December 03, 2021 FINDINGS: The heart size and mediastinal contours are within normal limits. Both lungs are clear. The visualized skeletal structures are unremarkable. IMPRESSION: No active cardiopulmonary disease. Electronically Signed   By: Suzen Dials M.D.   On: 07/20/2024 17:47     Procedures   Medications  Ordered in the ED  albuterol  (VENTOLIN  HFA) 108 (90 Base) MCG/ACT inhaler 1-2 puff (has no administration in time range)  AeroChamber Plus Flo-Vu Medium MISC 1 each (has no administration in time range)  ketorolac  (TORADOL ) 30 MG/ML injection 30 mg (30 mg Intramuscular Given 07/20/24 1745)  dexamethasone (DECADRON) injection 10 mg (10 mg Intramuscular Given 07/20/24 1745)  doxycycline (VIBRA-TABS) tablet 100 mg (100 mg Oral Given 07/20/24 1746)                                    Medical Decision Making Risk Prescription drug management.   This patient presents to the ED for concern of cough, this involves an extensive number of treatment options, and is a complaint that carries with it a high risk of complications and morbidity.  The differential diagnosis includes covid/flu/rsv, pna, bronchitis   Co morbidities that complicate the patient evaluation  adhd, bipolar d/o, and fibromyalgia   Additional history obtained:  Additional history obtained from epic chart review    Lab Tests:  I Ordered, and personally interpreted labs.  The pertinent results include:  covid/flu/rsv neg   Imaging Studies ordered:  I ordered imaging studies including cxr  I independently visualized and interpreted imaging which showed neg I agree with the radiologist interpretation  Medicines ordered and prescription drug management:  I ordered medication including decadron/toradol /doxy  for sx  Reevaluation of the patient after these medicines showed that the patient improved I have reviewed the patients home medicines and have made adjustments as needed   Problem List / ED Course:  Bronchitis:  pt is not hypoxic.  She is stable for d/c home.  She is to return if worse.  F/u with pcp.   Reevaluation:  After the interventions noted above, I reevaluated the patient and found that they have :improved   Social Determinants of Health:  Lives at home   Dispostion:  After consideration  of the diagnostic results and the patients response to treatment, I feel that the patent would benefit from discharge with outpatient f/u.       Final diagnoses:  Acute bronchitis, unspecified organism  Tobacco abuse    ED Discharge Orders          Ordered    doxycycline (VIBRAMYCIN) 100 MG capsule  2 times daily        07/20/24 1754    predniSONE  (DELTASONE ) 50 MG tablet  Daily with breakfast        07/20/24 1754    ibuprofen  (ADVIL ) 600 MG tablet  Every 6 hours PRN        07/20/24 1754               Chastidy Ranker, MD 07/20/24 1756  Pt called after d/c and said she is allergic to doxy.  She had a dose while here and had no reaction and lists no allergies on her chart.  So, I changed her abx to augmentin .   Dean Clarity, MD 07/20/24 [phone]

## 2024-07-20 NOTE — ED Triage Notes (Signed)
 Cough, fever, chills and nausea x 2 days.

## 2024-09-19 ENCOUNTER — Other Ambulatory Visit: Payer: Self-pay

## 2024-09-19 ENCOUNTER — Emergency Department (HOSPITAL_COMMUNITY)
Admission: EM | Admit: 2024-09-19 | Discharge: 2024-09-19 | Disposition: A | Discharge status: Home / Self Care | Attending: Emergency Medicine | Admitting: Emergency Medicine

## 2024-09-19 ENCOUNTER — Encounter (HOSPITAL_COMMUNITY): Payer: Self-pay

## 2024-09-19 ENCOUNTER — Emergency Department (HOSPITAL_COMMUNITY)

## 2024-09-19 DIAGNOSIS — J069 Acute upper respiratory infection, unspecified: Secondary | ICD-10-CM | POA: Diagnosis not present

## 2024-09-19 DIAGNOSIS — R509 Fever, unspecified: Secondary | ICD-10-CM | POA: Diagnosis present

## 2024-09-19 LAB — COMPREHENSIVE METABOLIC PANEL WITH GFR
ALT: 13 U/L (ref 0–44)
AST: 21 U/L (ref 15–41)
Albumin: 4.2 g/dL (ref 3.5–5.0)
Alkaline Phosphatase: 56 U/L (ref 38–126)
Anion gap: 11 (ref 5–15)
BUN: 12 mg/dL (ref 6–20)
CO2: 26 mmol/L (ref 22–32)
Calcium: 9.2 mg/dL (ref 8.9–10.3)
Chloride: 102 mmol/L (ref 98–111)
Creatinine, Ser: 0.62 mg/dL (ref 0.44–1.00)
GFR, Estimated: >60 mL/min
Glucose, Bld: 93 mg/dL (ref 70–99)
Potassium: 4 mmol/L (ref 3.5–5.1)
Sodium: 139 mmol/L (ref 135–145)
Total Bilirubin: <0.2 mg/dL (ref 0.0–1.2)
Total Protein: 6.6 g/dL (ref 6.5–8.1)

## 2024-09-19 LAB — CBC WITH DIFFERENTIAL/PLATELET
Abs Immature Granulocytes: 0.01 K/uL (ref 0.00–0.07)
Basophils Absolute: 0.1 K/uL (ref 0.0–0.1)
Basophils Relative: 1 %
Eosinophils Absolute: 0.3 K/uL (ref 0.0–0.5)
Eosinophils Relative: 3 %
HCT: 39.8 % (ref 36.0–46.0)
Hemoglobin: 13.2 g/dL (ref 12.0–15.0)
Immature Granulocytes: 0 %
Lymphocytes Relative: 34 %
Lymphs Abs: 2.6 K/uL (ref 0.7–4.0)
MCH: 32.3 pg (ref 26.0–34.0)
MCHC: 33.2 g/dL (ref 30.0–36.0)
MCV: 97.3 fL (ref 80.0–100.0)
Monocytes Absolute: 0.5 K/uL (ref 0.1–1.0)
Monocytes Relative: 6 %
Neutro Abs: 4.4 K/uL (ref 1.7–7.7)
Neutrophils Relative %: 56 %
Platelets: 232 K/uL (ref 150–400)
RBC: 4.09 MIL/uL (ref 3.87–5.11)
RDW: 12.3 % (ref 11.5–15.5)
WBC: 7.8 K/uL (ref 4.0–10.5)
nRBC: 0 % (ref 0.0–0.2)

## 2024-09-19 LAB — URINALYSIS, W/ REFLEX TO CULTURE (INFECTION SUSPECTED)
Bacteria, UA: NONE SEEN
Bilirubin Urine: NEGATIVE
Glucose, UA: NEGATIVE mg/dL
Hgb urine dipstick: NEGATIVE
Ketones, ur: NEGATIVE mg/dL
Leukocytes,Ua: NEGATIVE
Nitrite: NEGATIVE
Protein, ur: NEGATIVE mg/dL
Specific Gravity, Urine: 1.032 — ABNORMAL HIGH (ref 1.005–1.030)
pH: 5 (ref 5.0–8.0)

## 2024-09-19 LAB — RESP PANEL BY RT-PCR (RSV, FLU A&B, COVID)  RVPGX2
Influenza A by PCR: NEGATIVE
Influenza B by PCR: NEGATIVE
Resp Syncytial Virus by PCR: NEGATIVE
SARS Coronavirus 2 by RT PCR: NEGATIVE

## 2024-09-19 MED ORDER — ONDANSETRON 4 MG PO TBDP: 4.0000 mg | ORAL_TABLET | Freq: Three times a day (TID) | ORAL | 0 refills | Status: AC | PRN

## 2024-09-19 MED ORDER — PREDNISONE 10 MG PO TABS: 20.0000 mg | ORAL_TABLET | Freq: Every day | ORAL | 0 refills | Status: AC

## 2024-09-19 MED ORDER — BENZONATATE 100 MG PO CAPS: 100.0000 mg | ORAL_CAPSULE | Freq: Three times a day (TID) | ORAL | 0 refills | Status: AC | PRN

## 2024-09-19 NOTE — ED Provider Notes (Signed)
 " [place] EMERGENCY DEPARTMENT AT [facility] Provider Note   CSN: [id number] Arrival date & time: 09/19/24  8091     Patient presents with: Cough and Fever   Sarah Morrow is a 49 y.o. female.    Cough Associated symptoms: fever, myalgias and sore throat   Fever Associated symptoms: cough, myalgias and sore throat   Patient presents for flulike symptoms.  Medical history includes bipolar disorder, IBS, fibromyalgia, anxiety.  Onset of symptoms was yesterday.  This has included cough, fevers, myalgias, sore throat.  She feels like she is getting recurrent infections.  She states that she did complete a course of antibiotics a month ago for similar symptoms.     Prior to Admission medications  Medication Sig Start Date End Date Taking? Authorizing Provider  benzonatate  (TESSALON ) 100 MG capsule Take 1 capsule (100 mg total) by mouth 3 (three) times daily as needed for cough. 09/19/24  Yes Melvenia Motto, MD  ondansetron  (ZOFRAN -ODT) 4 MG disintegrating tablet Take 1 tablet (4 mg total) by mouth every 8 (eight) hours as needed. 09/19/24  Yes Melvenia Motto, MD  predniSONE  (DELTASONE ) 10 MG tablet Take 2 tablets (20 mg total) by mouth daily for 4 days. 09/19/24 09/23/24 Yes Melvenia Motto, MD  ADDERALL XR 30 MG 24 hr capsule Take 30 mg by mouth daily. 06/28/22   [provider]  amoxicillin  (AMOXIL ) 875 MG tablet Take 1 tablet (875 mg total) by mouth 2 (two) times daily. 12/19/23   Maranda Jamee Jacob, MD  amoxicillin -clavulanate (AUGMENTIN ) 875-125 MG tablet Take 1 tablet by mouth every 12 (twelve) hours. 07/20/24   Haviland, Julie, MD  buprenorphine-naloxone (SUBOXONE) 2-0.5 mg SUBL SL tablet Place under the tongue.    [provider]  clonazePAM (KLONOPIN) 1 MG tablet Take 1 mg by mouth 4 (four) times daily as needed for anxiety.     [provider]  ibuprofen  (ADVIL ) 600 MG tablet Take 1 tablet (600 mg total) by mouth every 6 (six) hours as needed. 07/20/24    Dean Clarity, MD  predniSONE  (DELTASONE ) 50 MG tablet Take 1 tablet (50 mg total) by mouth daily with breakfast. 07/20/24   Dean Clarity, MD  sodium chloride  (OCEAN) 0.65 % SOLN nasal spray Place 1 spray into both nostrils as needed for congestion. 10/07/14 12/15/14  Hess, Catheryn HERO, PA-C    Allergies: Patient has no known allergies.    Review of Systems  Constitutional:  Positive for fever.  HENT:  Positive for sore throat.   Respiratory:  Positive for cough.   Musculoskeletal:  Positive for myalgias.  All other systems reviewed and are negative.   Updated Vital Signs BP (!) 118/93 (BP Location: Right Arm)   Pulse 86   Temp 98.2 F (36.8 C) (Oral)   Resp 14   Ht 5' 4 (1.626 m)   Wt 55.3 kg   LMP 09/01/2024 (Approximate)   SpO2 99%   BMI 20.94 kg/m   Physical Exam Vitals and nursing note reviewed.  Constitutional:      General: She is not in acute distress.    Appearance: Normal appearance. She is well-developed. She is not ill-appearing, toxic-appearing or diaphoretic.  HENT:     Head: Normocephalic and atraumatic.     Right Ear: External ear normal.     Left Ear: External ear normal.     Nose: Nose normal.     Mouth/Throat:     Mouth: Mucous membranes are moist.  Eyes:  Extraocular Movements: Extraocular movements intact.     Conjunctiva/sclera: Conjunctivae normal.  Cardiovascular:     Rate and Rhythm: Normal rate and regular rhythm.  Pulmonary:     Effort: Pulmonary effort is normal. No respiratory distress.  Abdominal:     General: There is no distension.     Palpations: Abdomen is soft.  Musculoskeletal:        General: No swelling. Normal range of motion.     Cervical back: Normal range of motion and neck supple.  Skin:    General: Skin is warm and dry.     Coloration: Skin is not jaundiced or pale.  Neurological:     General: No focal deficit present.     Mental Status: She is alert and oriented to person, place, and time.  Psychiatric:         Mood and Affect: Mood normal.        Behavior: Behavior normal.     (all labs ordered are listed, but only abnormal results are displayed) Labs Reviewed  URINALYSIS, W/ REFLEX TO CULTURE (INFECTION SUSPECTED) - Abnormal; Notable for the following components:      Result Value   Color, Urine AMBER (*)    APPearance HAZY (*)    Specific Gravity, Urine 1.032 (*)    All other components within normal limits  RESP PANEL BY RT-PCR (RSV, FLU A&B, COVID)  RVPGX2  COMPREHENSIVE METABOLIC PANEL WITH GFR  CBC WITH DIFFERENTIAL/PLATELET    EKG: None  Radiology: DG Chest 2 View Result Date: 09/19/2024 EXAM: 2 VIEW(S) XRAY OF THE CHEST 09/19/2024 09:41:52 PM COMPARISON: 07/20/2024 CLINICAL HISTORY: cough cough cough cough cough FINDINGS: LUNGS AND PLEURA: No focal pulmonary opacity. No pleural effusion. No pneumothorax. HEART AND MEDIASTINUM: No acute abnormality of the cardiac and mediastinal silhouettes. BONES AND SOFT TISSUES: No acute osseous abnormality. IMPRESSION: 1. No acute process. Electronically signed by: Greig Pique MD 09/19/2024 09:43 PM EST RP Workstation: [id number]     Procedures   Medications Ordered in the ED - No data to display                                  Medical Decision Making Amount and/or Complexity of Data Reviewed Labs: ordered.   Patient presenting for flulike symptoms since yesterday.  On arrival in the ED, her vital signs are normal.  She is afebrile.  Her workup was initiated prior to being bedded in the ED.  Results showed normal hemoglobin, no leukocytosis, normal kidney function, normal electrolytes.  There is no evidence of UTI.  COVID/flu/RSV were negative.  Chest x-ray did not show any acute findings.  On exam, patient is well-appearing.  Current breathing is unlabored.  She was informed of reassuring workup results.  I suspect a viral illness other than those tested for today.  Patient was advised to continue supportive care.  She does request  Zofran , cough suppressant, and prednisone , as prednisone  has been helpful to her in the past for similar symptoms.  These were prescribed.  Patient was discharged in good condition.     Final diagnoses:  Upper respiratory tract infection, unspecified type    ED Discharge Orders          Ordered    ondansetron  (ZOFRAN -ODT) 4 MG disintegrating tablet  Every 8 hours PRN        09/19/24 2345    benzonatate  (TESSALON ) 100 MG capsule  3 times  daily PRN        09/19/24 2345    predniSONE  (DELTASONE ) 10 MG tablet  Daily        09/19/24 2345               Melvenia Motto, MD 09/19/24 2345  "

## 2024-09-19 NOTE — Discharge Instructions (Signed)
 Your test results today were reassuring.  I suspect that you have a viral illness.  Treatment for this is supportive care and time.  Prescriptions were sent to your pharmacy: -Take prednisone  daily for the next 4 days. -Take ondansetron  as needed for nausea. -Take benzonatate  as needed for cough.  Take ibuprofen  and Tylenol  as needed for fevers, chills, body aches, sore throat, headaches.  Drink plenty of fluids to stay well-hydrated.  Follow-up with your primary care doctor.  Return to the emergency department for any new or worsening symptoms of concern.

## 2024-09-19 NOTE — ED Triage Notes (Signed)
 Pt c/o of flu like symptoms; pt states symptom onset last night; states she has had two other similar episodes with minor periods of relief in between events (2-3 days); pt endorses nausea, denies V/D, pt states headache, 8/10; no fever in triage; A&Ox4

## 2024-09-19 NOTE — ED Notes (Signed)
 Patient came to nurse station asking for warm blanket. Patient was not sure if she had a temp in triage. Rechecked patient's temp 98.2 oral. Blanket given at this time. Patient also reminded to not to continue to eat or drink because she has not been seen by EDP
